# Patient Record
Sex: Male | Born: 1965 | Race: White | Hispanic: No | Marital: Single | Smoking: Never smoker
Health system: Southern US, Community
[De-identification: ages and names within clinical notes are randomized; demographics above are authoritative.]

## PROBLEM LIST (undated history)

## (undated) DIAGNOSIS — F32A Depression, unspecified: Secondary | ICD-10-CM

## (undated) DIAGNOSIS — F1910 Other psychoactive substance abuse, uncomplicated: Secondary | ICD-10-CM

## (undated) DIAGNOSIS — N611 Abscess of the breast and nipple: Secondary | ICD-10-CM

## (undated) DIAGNOSIS — Z87442 Personal history of urinary calculi: Secondary | ICD-10-CM

## (undated) DIAGNOSIS — F419 Anxiety disorder, unspecified: Secondary | ICD-10-CM

## (undated) DIAGNOSIS — R222 Localized swelling, mass and lump, trunk: Principal | ICD-10-CM

## (undated) DIAGNOSIS — M1990 Unspecified osteoarthritis, unspecified site: Secondary | ICD-10-CM

## (undated) HISTORY — DX: Anxiety disorder, unspecified: F41.9

## (undated) HISTORY — DX: Abscess of the breast and nipple: N61.1

## (undated) HISTORY — DX: Unspecified osteoarthritis, unspecified site: M19.90

## (undated) HISTORY — DX: Other psychoactive substance abuse, uncomplicated: F19.10

## (undated) HISTORY — DX: Localized swelling, mass and lump, trunk: R22.2

## (undated) HISTORY — DX: Depression, unspecified: F32.A

---

## 2006-04-10 DIAGNOSIS — N611 Abscess of the breast and nipple: Secondary | ICD-10-CM

## 2006-04-10 HISTORY — DX: Abscess of the breast and nipple: N61.1

## 2006-04-10 HISTORY — PX: ABCESS DRAINAGE: SHX399

## 2013-09-11 ENCOUNTER — Emergency Department: Payer: Self-pay | Admitting: Emergency Medicine

## 2013-09-11 LAB — CBC
HCT: 51.5 % (ref 40.0–52.0)
HGB: 16.8 g/dL (ref 13.0–18.0)
MCH: 30.3 pg (ref 26.0–34.0)
MCHC: 32.6 g/dL (ref 32.0–36.0)
MCV: 93 fL (ref 80–100)
PLATELETS: 195 10*3/uL (ref 150–440)
RBC: 5.54 10*6/uL (ref 4.40–5.90)
RDW: 14.3 % (ref 11.5–14.5)
WBC: 10.3 10*3/uL (ref 3.8–10.6)

## 2013-09-11 LAB — URINALYSIS, COMPLETE
BLOOD: NEGATIVE
Bacteria: NONE SEEN
Bilirubin,UR: NEGATIVE
GLUCOSE, UR: NEGATIVE mg/dL (ref 0–75)
Ketone: NEGATIVE
LEUKOCYTE ESTERASE: NEGATIVE
Nitrite: NEGATIVE
PH: 6 (ref 4.5–8.0)
PROTEIN: NEGATIVE
RBC,UR: 1 /HPF (ref 0–5)
Specific Gravity: 1.019 (ref 1.003–1.030)
Squamous Epithelial: NONE SEEN
WBC UR: 2 /HPF (ref 0–5)

## 2013-09-11 LAB — COMPREHENSIVE METABOLIC PANEL
ALBUMIN: 4 g/dL (ref 3.4–5.0)
AST: 27 U/L (ref 15–37)
Alkaline Phosphatase: 77 U/L
Anion Gap: 5 — ABNORMAL LOW (ref 7–16)
BUN: 14 mg/dL (ref 7–18)
Bilirubin,Total: 0.4 mg/dL (ref 0.2–1.0)
CO2: 26 mmol/L (ref 21–32)
Calcium, Total: 9.5 mg/dL (ref 8.5–10.1)
Chloride: 107 mmol/L (ref 98–107)
Creatinine: 0.87 mg/dL (ref 0.60–1.30)
GLUCOSE: 87 mg/dL (ref 65–99)
Osmolality: 276 (ref 275–301)
POTASSIUM: 4 mmol/L (ref 3.5–5.1)
SGPT (ALT): 44 U/L (ref 12–78)
Sodium: 138 mmol/L (ref 136–145)
Total Protein: 7.7 g/dL (ref 6.4–8.2)

## 2013-12-27 ENCOUNTER — Emergency Department: Payer: Self-pay

## 2017-01-15 ENCOUNTER — Emergency Department
Admission: EM | Admit: 2017-01-15 | Discharge: 2017-01-15 | Disposition: A | Discharge status: Home / Self Care | Payer: Self-pay | Attending: Emergency Medicine | Admitting: Emergency Medicine

## 2017-01-15 ENCOUNTER — Emergency Department: Payer: Self-pay

## 2017-01-15 DIAGNOSIS — R0789 Other chest pain: Secondary | ICD-10-CM | POA: Insufficient documentation

## 2017-01-15 DIAGNOSIS — R222 Localized swelling, mass and lump, trunk: Secondary | ICD-10-CM | POA: Insufficient documentation

## 2017-01-15 LAB — CBC
HCT: 44.3 % (ref 40.0–52.0)
Hemoglobin: 15.3 g/dL (ref 13.0–18.0)
MCH: 31.8 pg (ref 26.0–34.0)
MCHC: 34.5 g/dL (ref 32.0–36.0)
MCV: 92.2 fL (ref 80.0–100.0)
PLATELETS: 182 10*3/uL (ref 150–440)
RBC: 4.8 MIL/uL (ref 4.40–5.90)
RDW: 13.3 % (ref 11.5–14.5)
WBC: 9.7 10*3/uL (ref 3.8–10.6)

## 2017-01-15 LAB — COMPREHENSIVE METABOLIC PANEL
ALT: 84 U/L — AB (ref 17–63)
AST: 59 U/L — ABNORMAL HIGH (ref 15–41)
Albumin: 4 g/dL (ref 3.5–5.0)
Alkaline Phosphatase: 56 U/L (ref 38–126)
Anion gap: 7 (ref 5–15)
BUN: 13 mg/dL (ref 6–20)
CHLORIDE: 108 mmol/L (ref 101–111)
CO2: 23 mmol/L (ref 22–32)
CREATININE: 0.87 mg/dL (ref 0.61–1.24)
Calcium: 9.2 mg/dL (ref 8.9–10.3)
GFR calc Af Amer: >60 mL/min (ref >60)
GLUCOSE: 78 mg/dL (ref 65–99)
Potassium: 3.5 mmol/L (ref 3.5–5.1)
Sodium: 138 mmol/L (ref 135–145)
Total Bilirubin: 0.7 mg/dL (ref 0.3–1.2)
Total Protein: 7.7 g/dL (ref 6.5–8.1)

## 2017-01-15 LAB — TROPONIN I: Troponin I: <0.03 ng/mL (ref <0.03)

## 2017-01-15 NOTE — ED Triage Notes (Signed)
Pt states that he has been having some sob, states that he noticed a knot that came up on the left side of his chest yesterday and is uncertain if that is the problem, pt reports that it is tender to touch and denies injury, pt denies cough or congestion

## 2017-01-15 NOTE — Discharge Instructions (Signed)
Please follow up with surgery to have your nodule examined and a biopsy taken

## 2017-01-16 NOTE — ED Provider Notes (Signed)
[facility] Emergency Department Provider Note   ____________________________________________    I have reviewed the triage vital signs and the nursing notes.   HISTORY  Chief Complaint Shortness of Breath and Chest Pain     HPI Nicholas Lindsey is a 51 y.o. male who presents with chest pain which patient attributes to  a lump on his chest.Patient reports he noted a lump on the left lateral chest wall yesterday which is approximately 1 x 1 cm. It is firm and he notes it is definitely new. He states he feels sore around this area. No fevers or chills. No cough. No chest pain with exertion. Pain is mostly when he twists his torso. He describes it as a mild aching pain around the lump   No past medical history on file.  There are no active problems to display for this patient.   No past surgical history on file.  Prior to Admission medications   Not on File     Allergies Patient has no known allergies.  No family history on file.  Social History Positive smoking, social drinking Review of Systems  Constitutional: No fever/chills Eyes: No redness ENT: No sore throat. CV: As above Respiratory: No shortness of breath Gastrointestinal: No abdominal pain.  No nausea, no vomiting.   Genitourinary: Negative for dysuria. Musculoskeletal: Negative for back pain. Skin: Negative for rash. Neurological: Negative for headaches     ____________________________________________   PHYSICAL EXAM:  VITAL SIGNS: ED Triage Vitals  Enc Vitals Group     BP 01/15/17 1908 137/80     Pulse Rate 01/15/17 1908 73     Resp 01/15/17 1908 18     Temp 01/15/17 1908 98.8 F (37.1 C)     Temp Source 01/15/17 1908 Oral     SpO2 01/15/17 1908 98 %     Weight 01/15/17 1904 81.2 kg (179 lb)     Height 01/15/17 1904 1.702 m (5\' 7" )     Head Circumference --      Peak Flow --      Pain Score 01/15/17 1904 4     Pain Loc --      Pain Edu? --      Excl. in  [place]? --     Constitutional: Alert and oriented. No acute distress. Pleasant and interactive  Nose: No congestion/rhinnorhea. Mouth/Throat: Mucous membranes are moist.   Cardiovascular: Normal rate, regular rhythm. 1 x 1 cm palpable firm nodule just lateral to the sternum on the left approximately T4. Mild tenderness to palpation, no fluctuance Respiratory: Normal respiratory effort.  No retractions. Genitourinary: deferred Musculoskeletal: No lower extremity tenderness nor edema.   Neurologic:  Normal speech and language. No gross focal neurologic deficits are appreciated.   Skin:  Skin is warm, dry and intact. No rash noted.   ____________________________________________   LABS (all labs ordered are listed, but only abnormal results are displayed)  Labs Reviewed  COMPREHENSIVE METABOLIC PANEL - Abnormal; Notable for the following:       Result Value   AST 59 (*)    ALT 84 (*)    All other components within normal limits  TROPONIN I  CBC   ____________________________________________  EKG  ED ECG REPORT I, Lavonia Drafts, the attending physician, personally viewed and interpreted this ECG.  Date: 01/16/2017  Rhythm: normal sinus rhythm QRS Axis: normal Intervals: normal ST/T Wave abnormalities: normal Narrative Interpretation: no evidence of acute ischemia  ____________________________________________  RADIOLOGY  Chest  x-ray normal ____________________________________________   PROCEDURES  Procedure(s) performed: No    Critical Care performed: No ____________________________________________   INITIAL IMPRESSION / ASSESSMENT AND PLAN / ED COURSE  Pertinent labs & imaging results that were available during my care of the patient were reviewed by me and considered in my medical decision making (see chart for details).  Patient with nodule in the chest wall, this appears to cause him some discomfort. Unclear etiology of this nodule. He will need a biopsy  of this. Discussed this with him and the need for outpatient follow-up with surgery to arrange this. EKG is normal, labs are normal, chest x-ray is normal.   ____________________________________________   FINAL CLINICAL IMPRESSION(S) / ED DIAGNOSES  Final diagnoses:  Nodule of anterior chest wall  Chest wall pain      NEW MEDICATIONS STARTED DURING THIS VISIT:  There are no discharge medications for this patient.    Note:  This document was prepared using Dragon voice recognition software and may include unintentional dictation errors.    Lavonia Drafts, MD 01/16/17 [phone]

## 2017-01-19 DIAGNOSIS — R222 Localized swelling, mass and lump, trunk: Secondary | ICD-10-CM | POA: Insufficient documentation

## 2017-01-19 HISTORY — DX: Localized swelling, mass and lump, trunk: R22.2

## 2017-01-25 ENCOUNTER — Encounter: Payer: Self-pay | Admitting: General Surgery

## 2017-01-25 ENCOUNTER — Ambulatory Visit (INDEPENDENT_AMBULATORY_CARE_PROVIDER_SITE_OTHER): Payer: Self-pay | Admitting: General Surgery

## 2017-01-25 VITALS — BP 153/76 | HR 69 | Temp 97.5°F | Ht 67.0 in | Wt 177.8 lb

## 2017-01-25 DIAGNOSIS — R222 Localized swelling, mass and lump, trunk: Secondary | ICD-10-CM

## 2017-01-25 MED ORDER — HYDROCODONE-ACETAMINOPHEN 5-325 MG PO TABS: 1.0000 | ORAL_TABLET | Freq: Four times a day (QID) | ORAL | 0 refills | Status: DC | PRN

## 2017-01-25 NOTE — Progress Notes (Signed)
Patient ID: Nicholas Lindsey, male   DOB: 11-18-1965, 51 y.o.   MRN: [id number]  CC: painful chest wall mass  HPI Nicholas Lindsey is a 51 y.o. male Who presents to the clinic today for evaluation of a painful chest wall mass. He was seen in the ER for this recently and sent to [place] for continued follow-up of the aforementioned problem. Patient reports that he first noticed it about 3 months ago but has become increasingly painful. It was initially only intermittently causing him discomfort and now causes him discomfort all the time. He thinks it is getting larger. He's never had anything like this before. He has however had a history of foreign body removal from his left breast but that was a remote history and from an entirely different section of his chest. He denies any fevers, chills, nausea, vomiting, shortness of breath, diarrhea, constipation. He does have significant chest discomfort primarily at this area.  HPI  Past Medical History:  Diagnosis Date  . Left breast abscess 2008   I&D required- [place], [place] (per patient)  . Nodule of anterior chest wall 01/19/2017    Past Surgical History:  Procedure Laterality Date  . ABCESS DRAINAGE Left 2008   Breast- [place], [place]    Family History  Problem Relation Age of Onset  . Diabetes Mother   . Hypertension Mother   . Heart attack Father     Social History Social History  Substance Use Topics  . Smoking status: Never Smoker  . Smokeless tobacco: Current User    Types: Chew     Comment: 1/2 Can chew Daily  . Alcohol use Yes     Comment: 12 Beers / Weekly    No Known Allergies  Current Outpatient Prescriptions  Medication Sig Dispense Refill  . acetaminophen (TYLENOL) 325 MG tablet Take 650 mg by mouth every 6 (six) hours as needed.    Marland Kitchen ibuprofen (ADVIL,MOTRIN) 200 MG tablet Take 200 mg by mouth every 6 (six) hours as needed.     No current facility-administered medications for this visit.      Review of Systems A  multi-point review of systems was asked and was negative except for the findings documented in the history of present illness  Physical Exam Blood pressure (!) 153/76, pulse 69, temperature (!) 97.5 F (36.4 C), temperature source Oral, height 5\' 7"  (1.702 m), weight 80.6 kg (177 lb 12.8 oz). CONSTITUTIONAL: no acute distress. EYES: Pupils are equal, round, and reactive to light, Sclera are non-icteric. EARS, NOSE, MOUTH AND THROAT: The oropharynx is clear. The oral mucosa is pink and moist but he has poor dentition. Hearing is intact to voice. LYMPH NODES:  Lymph nodes in the neck are normal. RESPIRATORY:  Lungs are clear. There is normal respiratory effort, with equal breath sounds bilaterally, and without pathologic use of accessory muscles.his chest wall is tender especially at an obviously palpable area on the left side of his sternum at the level of his nipple. It is a hard 1 cm palpable area that appears to be attached to the rib at this point. CARDIOVASCULAR: Heart is regular without murmurs, gallops, or rubs. GI: The abdomen is soft, nontender, and nondistended. There are no palpable masses. There is no hepatosplenomegaly. There are normal bowel sounds in all quadrants. GU: Rectal deferred.   MUSCULOSKELETAL: Normal muscle strength and tone. No cyanosis or edema.   SKIN: Turgor is good and there are no pathologic skin lesions or ulcers. NEUROLOGIC: Motor and sensation  is grossly normal. Cranial nerves are grossly intact. PSYCH:  Oriented to person, place and time. Affect is normal.  Data Reviewed Chest x-ray from the ER reviewed which is documented as normal per the radiologist but there appears to be radiographic evidence of a calcified protuberance consistent with the painful finding on exam today.his labs from the ER are within normal limits with the exception of mildly elevated AST and ALT. I have personally reviewed the patient's imaging, laboratory findings and medical records.     Assessment    Painful chest wall mass    Plan    51 year old male with a painful chest wall mass that is new over the last 3 months. Questionable finding on radiograph which may represent this mass. Discussed with the patientthat since this is attached to the rib be better visualized with cross-sectional imaging. Also discussed that I would have him follow up with her partner that is a thoracic surgery specialist due to the intimate relation of this mass with the rib. Patient voiced understanding and agrees with this plan. We will obtain a CT scan as soon as possible as an outpatient status, provide him with a small prescription for pain medications, patient to follow-up with Dr. Genevive Bi     Time spent with the patient was 45 minutes, with more than 50% of the time spent in face-to-face education, counseling and care coordination.     Clayburn Pert, MD FACS General Surgeon 01/25/2017, 2:01 PM

## 2017-01-25 NOTE — Patient Instructions (Signed)
We have scheduled you for a CT Scan of your Chest. This has been scheduled on 01/29/17 at our [place] location. Please Check-in at 1045am, 15 minutes prior to your scheduled appointment. If you need to reschedule your Scan, you may do so by calling [phone].  Bring a list of medications with you to your appointment and nothing to eat or drink 4 hours prior to your CT Scan.  We will have you follow-up with Dr. Genevive Bi, our cardiothoracic surgeon after your CT Scan. Please see appointment information below.

## 2017-01-29 ENCOUNTER — Ambulatory Visit
Admission: RE | Admit: 2017-01-29 | Discharge: 2017-01-29 | Disposition: A | Discharge status: Home / Self Care | Payer: Self-pay | Source: Ambulatory Visit | Attending: General Surgery | Admitting: General Surgery

## 2017-01-29 ENCOUNTER — Telehealth: Payer: Self-pay

## 2017-01-29 DIAGNOSIS — R222 Localized swelling, mass and lump, trunk: Secondary | ICD-10-CM | POA: Insufficient documentation

## 2017-01-29 DIAGNOSIS — I700 Atherosclerosis of aorta: Secondary | ICD-10-CM | POA: Insufficient documentation

## 2017-01-29 MED ORDER — IOPAMIDOL (ISOVUE-300) INJECTION 61%
75.0000 mL | Freq: Once | INTRAVENOUS | Status: AC | PRN
  Administered 2017-01-29: 75 mL via INTRAVENOUS

## 2017-01-29 NOTE — Telephone Encounter (Signed)
Reviewed CT Scan with Dr. Genevive Bi. He would like patient notified of results and given his financial difficulty, he may decide if he would like to be further evaluated tomorrow by cardiothoracic surgery.  Call made to home number. Spoke with patient's brother. He states that patient is unavailable at this time. Patient's brother is emergency contact on chart. I reviewed CT results with him and explained that the patient can decide if he would like to be further evaluated by Dr. Genevive Bi tomorrow. He may call and cancel if this appointment if he feels that this appointment is no longer needed based on results of CT Scan.

## 2017-01-30 ENCOUNTER — Encounter: Payer: Self-pay | Admitting: Cardiothoracic Surgery

## 2017-01-30 ENCOUNTER — Ambulatory Visit (INDEPENDENT_AMBULATORY_CARE_PROVIDER_SITE_OTHER): Payer: Self-pay | Admitting: Cardiothoracic Surgery

## 2017-01-30 VITALS — BP 157/79 | HR 71 | Temp 98.1°F | Resp 16 | Ht 67.0 in | Wt 181.2 lb

## 2017-01-30 DIAGNOSIS — D167 Benign neoplasm of ribs, sternum and clavicle: Secondary | ICD-10-CM

## 2017-01-30 MED ORDER — GABAPENTIN 100 MG PO CAPS: 100.0000 mg | ORAL_CAPSULE | Freq: Three times a day (TID) | ORAL | 0 refills | Status: DC

## 2017-01-30 NOTE — Progress Notes (Signed)
  Patient ID: Nicholas Lindsey, male   DOB: May 24, 1965, 51 y.o.   MRN: [id number]  HISTORY: He returns today in follow-up.  He did see 1 of my partners Dr. Oliver Pila last week who obtained a CT scan of the chest.  The patient states that he continues to have pain along the left side of his sternum.  He also describes episodes of pain that involves his entire anterior chest wall.  He states that this is almost constant in nature.  It is exacerbated by pushing on the sternum.  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  He thinks that the Vicodin have helped overall.   Vitals:   01/30/17 1043  BP: (!) 157/79  Pulse: 71  Resp: 16  Temp: 98.1 F (36.7 C)  SpO2: 97%     EXAM:    Resp: Lungs are clear bilaterally.  No respiratory distress, normal effort. Heart:  Regular without murmurs Abd:  Abdomen is soft, non distended and non tender. No masses are palpable.  There is no rebound and no guarding.  Neurological: Alert and oriented to person, place, and time. Coordination normal.  Skin: Skin is warm and dry. No rash noted. No diaphoretic. No erythema. No pallor.  Psychiatric: Normal mood and affect. Normal behavior. Judgment and thought content normal.   There is a asymmetry to the body of the sternum.  There is no erythema.  There is no drainage.   ASSESSMENT: I have independently reviewed the patient's CT scan.  There is no soft tissue mass or bony tumor.  There is some asymmetry to the costal sternal junction.   PLAN:   I explained to the patient that there is no obvious pathology present.  However we will treat his pain with gabapentin.  He will continue the Vicodin as needed.  I explained to him that he can follow-up with his primary care physician for continued pain management as there is no obvious sternal fracture, sternal tumor, bone fracture or bone tumor.  There is no sign of any infection.  There is no lung or cardiac abnormality.    Nestor Lewandowsky, MD

## 2017-01-30 NOTE — Patient Instructions (Addendum)
Please pick up your prescription at the pharmacy. Begin this medication to help with the pain. Please see your Primary Care Physican for continuing medication if this helps.  You do not need to follow-up unless your pain or symptoms worsen in this area.  I feel as though Arthritis at the joint between your sternum and ribs are causing this area of pain and swelling on your chest.  Arthritis Arthritis is a term that is commonly used to refer to joint pain or joint disease. There are more than 100 types of arthritis. What are the causes? The most common cause of this condition is wear and tear of a joint. Other causes include:  Gout.  Inflammation of a joint.  An infection of a joint.  Sprains and other injuries near the joint.  A drug reaction or allergic reaction.  In some cases, the cause may not be known. What are the signs or symptoms? The main symptom of this condition is pain in the joint with movement. Other symptoms include:  Redness, swelling, or stiffness at a joint.  Warmth coming from the joint.  Fever.  Overall feeling of illness.  How is this diagnosed? This condition may be diagnosed with a physical exam and tests, including:  Blood tests.  Urine tests.  Imaging tests, such as MRI, X-rays, or a CT scan.  Sometimes, fluid is removed from a joint for testing. How is this treated? Treatment for this condition may involve:  Treatment of the cause, if it is known.  Rest.  Raising (elevating) the joint.  Applying cold or hot packs to the joint.  Medicines to improve symptoms and reduce inflammation.  Injections of a steroid such as cortisone into the joint to help reduce pain and inflammation.  Depending on the cause of your arthritis, you may need to make lifestyle changes to reduce stress on your joint. These changes may include exercising more and losing weight. Follow these instructions at home: Medicines  Take over-the-counter and  prescription medicines only as told by your health care provider.  Do not take aspirin to relieve pain if gout is suspected. Activity  Rest your joint if told by your health care provider. Rest is important when your disease is active and your joint feels painful, swollen, or stiff.  Avoid activities that make the pain worse. It is important to balance activity with rest.  Exercise your joint regularly with range-of-motion exercises as told by your health care provider. Try doing low-impact exercise, such as: ? Swimming. ? Water aerobics. ? Biking. ? Walking. Joint Care   If your joint is swollen, keep it elevated if told by your health care provider.  If your joint feels stiff in the morning, try taking a warm shower.  If directed, apply heat to the joint. If you have diabetes, do not apply heat without permission from your health care provider. ? Put a towel between the joint and the hot pack or heating pad. ? Leave the heat on the area for 20-30 minutes.  If directed, apply ice to the joint: ? Put ice in a plastic bag. ? Place a towel between your skin and the bag. ? Leave the ice on for 20 minutes, 2-3 times per day.  Keep all follow-up visits as told by your health care provider. This is important. Contact a health care provider if:  The pain gets worse.  You have a fever. Get help right away if:  You develop severe joint pain, swelling, or redness.  Many joints become painful and swollen.  You develop severe back pain.  You develop severe weakness in your leg.  You cannot control your bladder or bowels. This information is not intended to replace advice given to you by your health care provider. Make sure you discuss any questions you have with your health care provider. Document Released: 05/04/2004 Document Revised: 09/02/2015 Document Reviewed: 06/22/2014 Elsevier Interactive Patient Education  [employer].

## 2017-12-02 ENCOUNTER — Other Ambulatory Visit: Payer: Self-pay

## 2017-12-02 ENCOUNTER — Emergency Department: Payer: Self-pay

## 2017-12-02 ENCOUNTER — Encounter: Payer: Self-pay | Admitting: *Deleted

## 2017-12-02 ENCOUNTER — Emergency Department
Admission: EM | Admit: 2017-12-02 | Discharge: 2017-12-02 | Disposition: A | Discharge status: Home / Self Care | Payer: Self-pay | Attending: Emergency Medicine | Admitting: Emergency Medicine

## 2017-12-02 DIAGNOSIS — R1084 Generalized abdominal pain: Secondary | ICD-10-CM | POA: Insufficient documentation

## 2017-12-02 DIAGNOSIS — R109 Unspecified abdominal pain: Secondary | ICD-10-CM

## 2017-12-02 DIAGNOSIS — F17220 Nicotine dependence, chewing tobacco, uncomplicated: Secondary | ICD-10-CM | POA: Insufficient documentation

## 2017-12-02 HISTORY — DX: Personal history of urinary calculi: Z87.442

## 2017-12-02 LAB — BASIC METABOLIC PANEL
Anion gap: 6 (ref 5–15)
BUN: 17 mg/dL (ref 6–20)
CO2: 26 mmol/L (ref 22–32)
CREATININE: 0.78 mg/dL (ref 0.61–1.24)
Calcium: 8.8 mg/dL — ABNORMAL LOW (ref 8.9–10.3)
Chloride: 107 mmol/L (ref 98–111)
GFR calc Af Amer: >60 mL/min (ref >60)
GLUCOSE: 101 mg/dL — AB (ref 70–99)
POTASSIUM: 3.4 mmol/L — AB (ref 3.5–5.1)
Sodium: 139 mmol/L (ref 135–145)

## 2017-12-02 LAB — CBC
HEMATOCRIT: 44.3 % (ref 40.0–52.0)
Hemoglobin: 15.3 g/dL (ref 13.0–18.0)
MCH: 32.7 pg (ref 26.0–34.0)
MCHC: 34.6 g/dL (ref 32.0–36.0)
MCV: 94.5 fL (ref 80.0–100.0)
Platelets: 203 10*3/uL (ref 150–440)
RBC: 4.69 MIL/uL (ref 4.40–5.90)
RDW: 13.8 % (ref 11.5–14.5)
WBC: 6.8 10*3/uL (ref 3.8–10.6)

## 2017-12-02 LAB — URINALYSIS, COMPLETE (UACMP) WITH MICROSCOPIC
BILIRUBIN URINE: NEGATIVE
Bacteria, UA: NONE SEEN
Glucose, UA: NEGATIVE mg/dL
HGB URINE DIPSTICK: NEGATIVE
KETONES UR: NEGATIVE mg/dL
Leukocytes, UA: NEGATIVE
NITRITE: NEGATIVE
PROTEIN: NEGATIVE mg/dL
Specific Gravity, Urine: 1.026 (ref 1.005–1.030)
pH: 5 (ref 5.0–8.0)

## 2017-12-02 MED ORDER — OXYCODONE-ACETAMINOPHEN 5-325 MG PO TABS: 1.0000 | ORAL_TABLET | Freq: Four times a day (QID) | ORAL | 0 refills | Status: DC | PRN

## 2017-12-02 MED ORDER — MORPHINE SULFATE (PF) 4 MG/ML IV SOLN
4.0000 mg | Freq: Once | INTRAVENOUS | Status: AC
  Administered 2017-12-02: 4 mg via INTRAVENOUS
  Filled 2017-12-02: qty 1

## 2017-12-02 MED ORDER — ONDANSETRON HCL 4 MG/2ML IJ SOLN
4.0000 mg | INTRAMUSCULAR | Status: AC
  Administered 2017-12-02: 4 mg via INTRAVENOUS
  Filled 2017-12-02: qty 2

## 2017-12-02 MED ORDER — OXYCODONE-ACETAMINOPHEN 5-325 MG PO TABS
2.0000 | ORAL_TABLET | Freq: Once | ORAL | Status: AC
  Administered 2017-12-02: 2 via ORAL
  Filled 2017-12-02: qty 2

## 2017-12-02 MED ORDER — DOCUSATE SODIUM 100 MG PO CAPS: ORAL_CAPSULE | ORAL | 0 refills | Status: DC

## 2017-12-02 MED ORDER — ONDANSETRON 4 MG PO TBDP: ORAL_TABLET | ORAL | 0 refills | Status: DC

## 2017-12-02 MED ORDER — KETOROLAC TROMETHAMINE 30 MG/ML IJ SOLN
15.0000 mg | Freq: Once | INTRAMUSCULAR | Status: AC
  Administered 2017-12-02: 15 mg via INTRAVENOUS
  Filled 2017-12-02: qty 1

## 2017-12-02 NOTE — ED Notes (Signed)
Patient transported to CT 

## 2017-12-02 NOTE — Discharge Instructions (Signed)
As we discussed, we think you most likely had a kidney stone stuck in your ureter that you have passed but is still causing you some pain and discomfort.  Please take over-the-counter ibuprofen (Advil) 600 mg 3 times a day with meals for no more than 5 days. Take Percocet as prescribed for severe pain. Do not drink alcohol, drive or participate in any other potentially dangerous activities while taking this medication as it may make you sleepy. Do not take this medication with any other sedating medications, either prescription or over-the-counter. If you were prescribed Percocet or Vicodin, do not take these with acetaminophen (Tylenol) as it is already contained within these medications.   This medication is an opiate (or narcotic) pain medication and can be habit forming.  Use it as little as possible to achieve adequate pain control.  Do not use or use it with extreme caution if you have a history of opiate abuse or dependence.  If you are on a pain contract with your primary care doctor or a pain specialist, be sure to let them know you were prescribed this medication today from the [facility].  This medication is intended for your use only - do not give any to anyone else and keep it in a secure place where nobody else, especially children, have access to it.  It will also cause or worsen constipation, so you may want to consider taking an over-the-counter stool softener while you are taking this medication.    Return to the emergency department if you develop new or worsening symptoms that concern you.

## 2017-12-02 NOTE — ED Notes (Signed)
ED Provider at bedside. 

## 2017-12-02 NOTE — ED Notes (Signed)
Pt c/o of left flank pain for the last 3 days, worse with urination.  Pt reports hx of the same, pt pain with light palpation.

## 2017-12-02 NOTE — ED Notes (Signed)
Peripheral IV discontinued. Catheter intact. No signs of infiltration or redness. Gauze applied to IV site.   Discharge instructions reviewed with patient. Questions fielded by this RN. Patient verbalizes understanding of instructions. Patient discharged home in stable condition per [facility]bach. No acute distress noted at time of discharge.    

## 2017-12-02 NOTE — ED Triage Notes (Signed)
EMS report: Pt c/o 3 days of intermittent L flank pain, worsening over past 2 hrs. Pt has hx of kidney stones, denies n/v at this time, is pale and visibly uncomfortable.

## 2017-12-02 NOTE — ED Provider Notes (Signed)
[facility] Emergency Department Provider Note  ____________________________________________   First MD Initiated Contact with Patient 12/02/17 0246     (approximate)  I have reviewed the triage vital signs and the nursing notes.   HISTORY  Chief Complaint Flank Pain    HPI Nicholas Lindsey is a 52 y.o. male's medical history includes multiple episodes of kidney stones in the past 2 presents for evaluation of 3 days of intermittent left flank pain that is severe, sharp, and stabbing.  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  He is not having any dysuria, blood in the urine, or difficulty with urination.  The pain is been rating from his left flank downwards.  The pain will occur and then go away completely and says that he had very severe pain yesterday and thought it was gone but then came back again tonight.  He denies nausea, vomiting, diarrhea, constipation.  He also denies fever/chills, chest pain or shortness of breath.    Past Medical History:  Diagnosis Date  . History of kidney stones   . Left breast abscess 2008   I&D required- [place], [place] (per patient)  . Nodule of anterior chest wall 01/19/2017    Patient Active Problem List   Diagnosis Date Noted  . Nodule of anterior chest wall 01/19/2017    Past Surgical History:  Procedure Laterality Date  . ABCESS DRAINAGE Left 2008   Breast- [place], [place]    Prior to Admission medications   Medication Sig Start Date End Date Taking? Authorizing Provider  acetaminophen (TYLENOL) 325 MG tablet Take 650 mg by mouth every 6 (six) hours as needed.    [provider]  docusate sodium (COLACE) 100 MG capsule Take 1 tablet once or twice daily as needed for constipation while taking narcotic pain medicine 12/02/17   Hinda Kehr, MD  gabapentin (NEURONTIN) 100 MG capsule  Take 1 capsule (100 mg total) by mouth 3 (three) times daily. 01/30/17   Nestor Lewandowsky, MD  ibuprofen (ADVIL,MOTRIN) 200 MG tablet Take 200 mg by mouth every 6 (six) hours as needed.    [provider]  ondansetron (ZOFRAN ODT) 4 MG disintegrating tablet Allow 1-2 tablets to dissolve in your mouth every 8 hours as needed for nausea/vomiting 12/02/17   Hinda Kehr, MD  oxyCODONE-acetaminophen (PERCOCET) 5-325 MG tablet Take 1-2 tablets by mouth every 6 (six) hours as needed for severe pain. 12/02/17   Hinda Kehr, MD    Allergies Patient has no known allergies.  Family History  Problem Relation Age of Onset  . Diabetes Mother   . Hypertension Mother   . Heart attack Father     Social History Social History   Tobacco Use  . Smoking status: Never Smoker  . Smokeless tobacco: Current User    Types: Chew  . Tobacco comment: 1/2 Can chew Daily  Substance Use Topics  . Alcohol use: Yes    Comment: 12 Beers / Weekly  . Drug use: No    Review of Systems Constitutional: No fever/chills Eyes: No visual changes. ENT: No sore throat. Cardiovascular: Denies chest pain. Respiratory: Denies shortness of breath. Gastrointestinal: No abdominal pain.  No nausea, no vomiting.  No diarrhea.  No constipation. Genitourinary: Negative for dysuria. Musculoskeletal: Left flank pain as described above.  Negative for neck pain.  Negative for  back pain. Integumentary: Negative for rash. Neurological: Negative for headaches, focal weakness or numbness.   ____________________________________________   PHYSICAL EXAM:  VITAL SIGNS: ED Triage Vitals  Enc Vitals Group     BP 12/02/17 0107 135/77     Pulse Rate 12/02/17 0107 79     Resp 12/02/17 0107 18     Temp 12/02/17 0107 97.9 F (36.6 C)     Temp Source 12/02/17 0107 Oral     SpO2 12/02/17 0103 98 %     Weight 12/02/17 0108 77.1 kg (170 lb)     Height 12/02/17 0108 1.702 m (5\' 7" )     Head Circumference --      Peak Flow --        Pain Score 12/02/17 0107 10     Pain Loc --      Pain Edu? --      Excl. in [place]? --     Constitutional: Alert and oriented.  Does not appear to be in pain at this time. Eyes: Conjunctivae are normal.  Head: Atraumatic. Nose: No congestion/rhinnorhea. Mouth/Throat: Mucous membranes are moist. Neck: No stridor.  No meningeal signs.   Cardiovascular: Normal rate, regular rhythm. Good peripheral circulation. Grossly normal heart sounds. Respiratory: Normal respiratory effort.  No retractions. Lungs CTAB. Gastrointestinal: Soft and nontender. No distention.  Musculoskeletal: Point tenderness to the left lateral posterior ribs which could be indicative of a contusion or rib injury but also could be left CVA tenderness.  No lower extremity tenderness nor edema. No gross deformities of extremities. Neurologic:  Normal speech and language. No gross focal neurologic deficits are appreciated.  Skin:  Skin is warm, dry and intact. No rash noted including specifically at the site of tenderness on his left flank Psychiatric: Mood and affect are normal. Speech and behavior are normal.  ____________________________________________   LABS (all labs ordered are listed, but only abnormal results are displayed)  Labs Reviewed  URINALYSIS, COMPLETE (UACMP) WITH MICROSCOPIC - Abnormal; Notable for the following components:      Result Value   Color, Urine YELLOW (*)    APPearance CLEAR (*)    All other components within normal limits  BASIC METABOLIC PANEL - Abnormal; Notable for the following components:   Potassium 3.4 (*)    Glucose, Bld 101 (*)    Calcium 8.8 (*)    All other components within normal limits  CBC   ____________________________________________  EKG  None - EKG not ordered by ED physician ____________________________________________  RADIOLOGY   ED MD interpretation: There is no evidence of any ureteral stones and no hydronephrosis or perinephric edema.  He does have  some small intrarenal stones bilaterally.  There is an area of abnormality most indicative of enteritis but this does not correlate clinically.  Official radiology report(s): Ct Renal Stone Study  Result Date: 12/02/2017 CLINICAL DATA:  Left flank pain.  Recurrent stone disease suspected. EXAM: CT ABDOMEN AND PELVIS WITHOUT CONTRAST TECHNIQUE: Multidetector CT imaging of the abdomen and pelvis was performed following the standard protocol without IV contrast. COMPARISON:  None. FINDINGS: Lower chest: Motion artifact, no acute findings. Hepatobiliary: Mild motion artifact. No evidence of focal lesion. Gallbladder physiologically distended, no calcified stone. No biliary dilatation. Pancreas: Motion artifact limitation, allowing for this, no evidence peripancreatic inflammation. No ductal dilatation. Spleen: No focal abnormality allowing for motion.  Normal in size. Adrenals/Urinary Tract: Normal adrenal glands. Two punctate nonobstructing stones in the left kidney. No hydronephrosis. No perinephric edema. Left ureter is  decompressed without stone along the course. Three nonobstructing stones in the right kidney without hydronephrosis. No perinephric edema. The right ureter is decompressed without stone along the course. Urinary bladder is partially distended without stone or wall thickening. Stomach/Bowel: Bowel evaluation is limited in the absence of enteric contrast. Additionally there is motion artifact through the upper abdomen. Stomach is partially distended. Small bowel wall thickening in the left abdomen with mildly dilated bowel loops, measuring up to 3.4 cm. No discrete transition point, however distal small bowel is nondistended. Mild associated mesenteric edema and small mesenteric nodes. Moderate colonic stool burden without colonic wall thickening. Normal appendix. Vascular/Lymphatic: Normal caliber abdominal aorta with mild atherosclerosis. Scattered prominent nodes including a hepatic caval node  measuring 11 mm image 27 series 2. There prominent central mesenteric nodes that remain subcentimeter. No pelvic adenopathy. Reproductive: Prostate is unremarkable. No free air, free fluid, or intra-abdominal fluid collection. Other: No free air, free fluid, or intra-abdominal fluid collection. 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 Degenerative change of both hips. Scattered bone islands in the pelvis. IMPRESSION: 1. Bilateral nonobstructing renal calculi. No hydronephrosis or obstructive uropathy. 2. Prominent small bowel in the left abdomen with wall thickening and mild mesenteric edema. No discrete transition point. Findings are suspicious for enteritis, but nonspecific. Detailed bowel evaluation is limited in the absence of enteric contrast. Electronically Signed   By: Jeb Levering M.D.   On: 12/02/2017 01:58    ____________________________________________   PROCEDURES  Critical Care performed: No   Procedure(s) performed:   Procedures   ____________________________________________   INITIAL IMPRESSION / ASSESSMENT AND PLAN / ED COURSE  As part of my medical decision making, I reviewed the following data within the [facility] notes reviewed and incorporated and Labs reviewed and check the [place] controlled substance database    Differential diagnosis includes, but is not limited to, renal colic/ureteral colic, UTI, pyelonephritis, musculoskeletal pain, rib injury (contusion versus fracture), renal ischemia, mesenteric ischemia.  The patient appears uncomfortable initially but does look better after morphine 4 mg IV, Zofran 4 mg IV, and Toradol 15 mg IV.  He is ambulatory without any difficulty.  He reports that he still hurts when I push on the area and he still has some pain just at rest.  I discussed the results of his work-up with him; his urinalysis was completely unremarkable, CBC  was normal, and his basic metabolic panel was also within normal limits except for slightly decreased potassium.  I told him about the findings on the CT scan that the radiologist suggested could represent a mild enteritis, but he is having no symptoms consistent with this at all including no vomiting, diarrhea, and no tenderness to palpation of the abdomen.  I told him that most likely he has passed some stones and has some residual renal colic is resolved and he seems comfortable with this explanation.  I have checked the drug database and he has no recent prescriptions and is at low risk for abuse so I wrote him prescriptions for Percocet and Zofran I gave my usual customary return precautions.  He understands and agrees with plan    ____________________________________________  FINAL CLINICAL IMPRESSION(S) / ED DIAGNOSES  Final diagnoses:  Left flank pain     MEDICATIONS GIVEN DURING THIS VISIT:  Medications  morphine 4 MG/ML injection 4 mg (4 mg Intravenous Given 12/02/17 0124)  ketorolac (TORADOL) 30 MG/ML injection 15 mg (15 mg Intravenous  Given 12/02/17 0126)  ondansetron (ZOFRAN) injection 4 mg (4 mg Intravenous Given 12/02/17 0122)  oxyCODONE-acetaminophen (PERCOCET/ROXICET) 5-325 MG per tablet 2 tablet (2 tablets Oral Given 12/02/17 0314)     ED Discharge Orders         Ordered    oxyCODONE-acetaminophen (PERCOCET) 5-325 MG tablet  Every 6 hours PRN     12/02/17 0258    docusate sodium (COLACE) 100 MG capsule     12/02/17 0258    ondansetron (ZOFRAN ODT) 4 MG disintegrating tablet     12/02/17 0258           Note:  This document was prepared using Dragon voice recognition software and may include unintentional dictation errors.    Hinda Kehr, MD 12/02/17 [phone]

## 2018-02-28 ENCOUNTER — Emergency Department
Admission: EM | Admit: 2018-02-28 | Discharge: 2018-02-28 | Disposition: A | Discharge status: Home / Self Care | Payer: Self-pay | Attending: Emergency Medicine | Admitting: Emergency Medicine

## 2018-02-28 ENCOUNTER — Other Ambulatory Visit: Payer: Self-pay

## 2018-02-28 ENCOUNTER — Encounter: Payer: Self-pay | Admitting: Emergency Medicine

## 2018-02-28 DIAGNOSIS — S50362A Insect bite (nonvenomous) of left elbow, initial encounter: Secondary | ICD-10-CM | POA: Insufficient documentation

## 2018-02-28 DIAGNOSIS — Y9289 Other specified places as the place of occurrence of the external cause: Secondary | ICD-10-CM | POA: Insufficient documentation

## 2018-02-28 DIAGNOSIS — R2232 Localized swelling, mass and lump, left upper limb: Secondary | ICD-10-CM | POA: Insufficient documentation

## 2018-02-28 DIAGNOSIS — Y998 Other external cause status: Secondary | ICD-10-CM | POA: Insufficient documentation

## 2018-02-28 DIAGNOSIS — F17228 Nicotine dependence, chewing tobacco, with other nicotine-induced disorders: Secondary | ICD-10-CM | POA: Insufficient documentation

## 2018-02-28 DIAGNOSIS — M25522 Pain in left elbow: Secondary | ICD-10-CM | POA: Insufficient documentation

## 2018-02-28 DIAGNOSIS — L03114 Cellulitis of left upper limb: Secondary | ICD-10-CM | POA: Insufficient documentation

## 2018-02-28 DIAGNOSIS — Y9389 Activity, other specified: Secondary | ICD-10-CM | POA: Insufficient documentation

## 2018-02-28 DIAGNOSIS — W57XXXA Bitten or stung by nonvenomous insect and other nonvenomous arthropods, initial encounter: Secondary | ICD-10-CM | POA: Insufficient documentation

## 2018-02-28 LAB — CBC WITH DIFFERENTIAL/PLATELET
Abs Immature Granulocytes: 0.03 10*3/uL (ref 0.00–0.07)
Basophils Absolute: 0 10*3/uL (ref 0.0–0.1)
Basophils Relative: 1 %
Eosinophils Absolute: 0.1 10*3/uL (ref 0.0–0.5)
Eosinophils Relative: 1 %
HEMATOCRIT: 46.2 % (ref 39.0–52.0)
HEMOGLOBIN: 15.3 g/dL (ref 13.0–17.0)
Immature Granulocytes: 0 %
LYMPHS ABS: 1.8 10*3/uL (ref 0.7–4.0)
LYMPHS PCT: 23 %
MCH: 30.7 pg (ref 26.0–34.0)
MCHC: 33.1 g/dL (ref 30.0–36.0)
MCV: 92.6 fL (ref 80.0–100.0)
MONO ABS: 0.7 10*3/uL (ref 0.1–1.0)
Monocytes Relative: 9 %
NEUTROS ABS: 5.2 10*3/uL (ref 1.7–7.7)
Neutrophils Relative %: 66 %
Platelets: 194 10*3/uL (ref 150–400)
RBC: 4.99 MIL/uL (ref 4.22–5.81)
RDW: 13.1 % (ref 11.5–15.5)
WBC: 7.8 10*3/uL (ref 4.0–10.5)
nRBC: 0 % (ref 0.0–0.2)

## 2018-02-28 MED ORDER — CLINDAMYCIN HCL 300 MG PO CAPS: 300.0000 mg | ORAL_CAPSULE | Freq: Three times a day (TID) | ORAL | 0 refills | Status: AC

## 2018-02-28 MED ORDER — TRAMADOL HCL 50 MG PO TABS
ORAL_TABLET | ORAL | Status: AC
  Filled 2018-02-28: qty 1

## 2018-02-28 MED ORDER — ONDANSETRON 4 MG PO TBDP
4.0000 mg | ORAL_TABLET | Freq: Once | ORAL | Status: AC
  Administered 2018-02-28: 4 mg via ORAL

## 2018-02-28 MED ORDER — CLINDAMYCIN PHOSPHATE 600 MG/50ML IV SOLN
600.0000 mg | Freq: Once | INTRAVENOUS | Status: AC
  Administered 2018-02-28: 600 mg via INTRAVENOUS
  Filled 2018-02-28: qty 50

## 2018-02-28 MED ORDER — NAPROXEN 500 MG PO TABS: 500.0000 mg | ORAL_TABLET | Freq: Two times a day (BID) | ORAL | 0 refills | Status: AC

## 2018-02-28 MED ORDER — TRAMADOL HCL 50 MG PO TABS
50.0000 mg | ORAL_TABLET | Freq: Once | ORAL | Status: AC
  Administered 2018-02-28: 50 mg via ORAL

## 2018-02-28 MED ORDER — KETOROLAC TROMETHAMINE 30 MG/ML IJ SOLN
30.0000 mg | Freq: Once | INTRAMUSCULAR | Status: AC
  Administered 2018-02-28: 30 mg via INTRAVENOUS
  Filled 2018-02-28: qty 1

## 2018-02-28 NOTE — ED Provider Notes (Addendum)
[facility] Emergency Department Provider Note ____________________________________________  Time seen: 1228  I have reviewed the triage vital signs and the nursing notes.  HISTORY  Chief Complaint  Insect Bite  HPI Nicholas Lindsey is a 52 y.o. male who presents himself to the ED for evaluation of pain and swelling to the antecubital region of his left elbow.  Patient describes he was working under house 2 days ago, when he felt a bite to his left forearm.  Since that time is had pain and swelling to the fold of the elbow.  Patient is unclear what bit him but he presents now with a red raised area.  He denies any itching, draining, or abscess formation.  He does note some tightness and swelling down the forearm into the hand.  Denies any interim fevers, chills, or sweats.  Patient has not taken any medications in the interim for symptom relief.  No history of anaphylaxis.  Past Medical History:  Diagnosis Date  . History of kidney stones   . Left breast abscess 2008   I&D required- [place], [place] (per patient)  . Nodule of anterior chest wall 01/19/2017    Patient Active Problem List   Diagnosis Date Noted  . Nodule of anterior chest wall 01/19/2017    Past Surgical History:  Procedure Laterality Date  . ABCESS DRAINAGE Left 2008   Breast- [place], [place]    Prior to Admission medications   Medication Sig Start Date End Date Taking? Authorizing Provider  acetaminophen (TYLENOL) 325 MG tablet Take 650 mg by mouth every 6 (six) hours as needed.    [provider]  clindamycin (CLEOCIN) 300 MG capsule Take 1 capsule (300 mg total) by mouth 3 (three) times daily for 10 days. 02/28/18 03/10/18  Rukia Mcgillivray, Dannielle Karvonen, PA-C  docusate sodium (COLACE) 100 MG capsule Take 1 tablet once or twice daily as needed for constipation while taking narcotic pain medicine 12/02/17   Hinda Kehr, MD  gabapentin (NEURONTIN) 100 MG capsule Take 1 capsule (100 mg total) by  mouth 3 (three) times daily. 01/30/17   Nestor Lewandowsky, MD  ibuprofen (ADVIL,MOTRIN) 200 MG tablet Take 200 mg by mouth every 6 (six) hours as needed.    [provider]  naproxen (NAPROSYN) 500 MG tablet Take 1 tablet (500 mg total) by mouth 2 (two) times daily with a meal for 15 days. 02/28/18 03/15/18  Harvy Riera, Dannielle Karvonen, PA-C  ondansetron (ZOFRAN ODT) 4 MG disintegrating tablet Allow 1-2 tablets to dissolve in your mouth every 8 hours as needed for nausea/vomiting 12/02/17   Hinda Kehr, MD  oxyCODONE-acetaminophen (PERCOCET) 5-325 MG tablet Take 1-2 tablets by mouth every 6 (six) hours as needed for severe pain. 12/02/17   Hinda Kehr, MD    Allergies Patient has no known allergies.  Family History  Problem Relation Age of Onset  . Diabetes Mother   . Hypertension Mother   . Heart attack Father     Social History Social History   Tobacco Use  . Smoking status: Never Smoker  . Smokeless tobacco: Current User    Types: Chew  . Tobacco comment: 1/2 Can chew Daily  Substance Use Topics  . Alcohol use: Yes    Comment: 12 Beers / Weekly  . Drug use: No    Review of Systems  Constitutional: Negative for fever. Eyes: Negative for visual changes. ENT: Negative for sore throat. Cardiovascular: Negative for chest pain. Respiratory: Negative for shortness of breath. Gastrointestinal: Negative  for abdominal pain, vomiting and diarrhea. Genitourinary: Negative for dysuria. Musculoskeletal: Negative for back pain. Skin: Negative for rash. Insect bite as above Neurological: Negative for headaches, focal weakness or numbness. ____________________________________________  PHYSICAL EXAM:  VITAL SIGNS: ED Triage Vitals [02/28/18 1152]  Enc Vitals Group     BP (!) 152/73     Pulse Rate 71     Resp 16     Temp (!) 97.2 F (36.2 C)     Temp Source Axillary     SpO2 100 %     Weight 155 lb (70.3 kg)     Height 5\' 7"  (1.702 m)     Head Circumference      Peak  Flow      Pain Score 6     Pain Loc      Pain Edu?      Excl. in [place]?     Constitutional: Alert and oriented. Well appearing and in no distress. Head: Normocephalic and atraumatic. Eyes: Conjunctivae are normal. Normal extraocular movements Cardiovascular: Normal rate, regular rhythm. Normal distal pulses. Respiratory: Normal respiratory effort. No wheezes/rales/rhonchi. Musculoskeletal: Nontender with normal range of motion in all extremities.  Neurologic:  Normal gait without ataxia. Normal speech and language. No gross focal neurologic deficits are appreciated. Skin:  Skin is warm, dry and intact. No rash noted.  Left antecubital region with a large erythematous whelp, consistent with a local reaction to insect bite.  There is no central punctum, necrosis, abscess formation, or skin breakdown.  The forearm distally is edematous to the hand.  No warmth, erythema, induration, streaking or lymphangitis is appreciated. Psychiatric: Mood and affect are normal. Patient exhibits appropriate insight and judgment. ____________________________________________   LABS (pertinent positives/negatives) Labs Reviewed  CBC WITH DIFFERENTIAL/PLATELET  ____________________________________________  PROCEDURES  Procedures Clindamycin 600 mg IVP Toradol 30 mg IVP ____________________________________________  INITIAL IMPRESSION / ASSESSMENT AND PLAN / ED COURSE  Patient with ED evaluation of local reaction to insect bite.  There is also some dependent edema noted to the same upper extremity.  Patient treated empirically with IV antibiotics.  He is afebrile without signs of an acute infectious process as the CBC is reassuring at this time.  He will be discharged with a prescription for oral clindamycin to take as directed.  He is encouraged to follow-up with the local community clinic for ongoing symptom management.  Return precautions have been  reviewed. ____________________________________________  FINAL CLINICAL IMPRESSION(S) / ED DIAGNOSES  Final diagnoses:  Insect bite of left elbow, initial encounter  Cellulitis of left upper extremity     Kyleena Scheirer, Dannielle Karvonen, PA-C 02/28/18 1347    Chalsea Darko, Dannielle Karvonen, PA-C 02/28/18 1350    Earleen Newport, MD 02/28/18 1409

## 2018-02-28 NOTE — Discharge Instructions (Signed)
You have a local reaction and some swelling following an insect bite. You are being treated with antibiotics for infection prevention. Take the antibiotic as directed, and the pain medicine as needed. Follow-up with [facility], or return as needed, for signs of worsening infection or swelling.

## 2018-02-28 NOTE — ED Triage Notes (Signed)
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 Patient states swelling and pain has been worsening since then. Patient has red raised area to antecubital area of left arm with swell down forearm and into hand. No redness noted above bite. Patient denies fever or chills.

## 2018-02-28 NOTE — ED Notes (Addendum)
See triage note.  Not sure what bit him, but he was under a house. Has area on left antecubital that is red and surrounding area is swollen without redness.  Good pulse and sensation distally.  Also says was on antibiotic for abscess in mouth.  Never did clear it up all the way, and then he moved here and it is back now.

## 2018-04-03 ENCOUNTER — Other Ambulatory Visit: Payer: Self-pay

## 2018-04-03 ENCOUNTER — Emergency Department: Payer: Self-pay

## 2018-04-03 ENCOUNTER — Inpatient Hospital Stay
Admission: EM | Admit: 2018-04-03 | Discharge: 2018-04-04 | DRG: 603 | Disposition: A | Discharge status: Home / Self Care | Payer: Self-pay | Attending: Internal Medicine | Admitting: Internal Medicine

## 2018-04-03 DIAGNOSIS — L03211 Cellulitis of face: Principal | ICD-10-CM

## 2018-04-03 DIAGNOSIS — E876 Hypokalemia: Secondary | ICD-10-CM | POA: Diagnosis present

## 2018-04-03 DIAGNOSIS — R739 Hyperglycemia, unspecified: Secondary | ICD-10-CM | POA: Diagnosis present

## 2018-04-03 DIAGNOSIS — Z791 Long term (current) use of non-steroidal anti-inflammatories (NSAID): Secondary | ICD-10-CM

## 2018-04-03 DIAGNOSIS — K047 Periapical abscess without sinus: Secondary | ICD-10-CM

## 2018-04-03 DIAGNOSIS — Z79891 Long term (current) use of opiate analgesic: Secondary | ICD-10-CM

## 2018-04-03 DIAGNOSIS — J320 Chronic maxillary sinusitis: Secondary | ICD-10-CM | POA: Diagnosis present

## 2018-04-03 DIAGNOSIS — Z560 Unemployment, unspecified: Secondary | ICD-10-CM

## 2018-04-03 DIAGNOSIS — Z79899 Other long term (current) drug therapy: Secondary | ICD-10-CM

## 2018-04-03 DIAGNOSIS — Z833 Family history of diabetes mellitus: Secondary | ICD-10-CM

## 2018-04-03 DIAGNOSIS — Z720 Tobacco use: Secondary | ICD-10-CM

## 2018-04-03 DIAGNOSIS — Z87442 Personal history of urinary calculi: Secondary | ICD-10-CM

## 2018-04-03 DIAGNOSIS — Z8249 Family history of ischemic heart disease and other diseases of the circulatory system: Secondary | ICD-10-CM

## 2018-04-03 DIAGNOSIS — S0240DA Maxillary fracture, left side, initial encounter for closed fracture: Secondary | ICD-10-CM | POA: Diagnosis present

## 2018-04-03 DIAGNOSIS — R4781 Slurred speech: Secondary | ICD-10-CM | POA: Diagnosis present

## 2018-04-03 LAB — CBC WITH DIFFERENTIAL/PLATELET
Abs Immature Granulocytes: 0.02 10*3/uL (ref 0.00–0.07)
Basophils Absolute: 0 10*3/uL (ref 0.0–0.1)
Basophils Relative: 1 %
Eosinophils Absolute: 0.2 10*3/uL (ref 0.0–0.5)
Eosinophils Relative: 2 %
HCT: 43.5 % (ref 39.0–52.0)
Hemoglobin: 14.7 g/dL (ref 13.0–17.0)
Immature Granulocytes: 0 %
LYMPHS PCT: 19 %
Lymphs Abs: 1.6 10*3/uL (ref 0.7–4.0)
MCH: 30.9 pg (ref 26.0–34.0)
MCHC: 33.8 g/dL (ref 30.0–36.0)
MCV: 91.4 fL (ref 80.0–100.0)
Monocytes Absolute: 0.7 10*3/uL (ref 0.1–1.0)
Monocytes Relative: 9 %
Neutro Abs: 5.7 10*3/uL (ref 1.7–7.7)
Neutrophils Relative %: 69 %
Platelets: 228 10*3/uL (ref 150–400)
RBC: 4.76 MIL/uL (ref 4.22–5.81)
RDW: 13.3 % (ref 11.5–15.5)
WBC: 8.2 10*3/uL (ref 4.0–10.5)
nRBC: 0 % (ref 0.0–0.2)

## 2018-04-03 LAB — HEPATIC FUNCTION PANEL
ALT: 53 U/L — AB (ref 0–44)
AST: 38 U/L (ref 15–41)
Albumin: 4.1 g/dL (ref 3.5–5.0)
Alkaline Phosphatase: 62 U/L (ref 38–126)
BILIRUBIN INDIRECT: 0.8 mg/dL (ref 0.3–0.9)
Bilirubin, Direct: 0.2 mg/dL (ref 0.0–0.2)
Total Bilirubin: 1 mg/dL (ref 0.3–1.2)
Total Protein: 8 g/dL (ref 6.5–8.1)

## 2018-04-03 LAB — BASIC METABOLIC PANEL
Anion gap: 10 (ref 5–15)
BUN: 21 mg/dL — ABNORMAL HIGH (ref 6–20)
CO2: 24 mmol/L (ref 22–32)
CREATININE: 0.84 mg/dL (ref 0.61–1.24)
Calcium: 8.7 mg/dL — ABNORMAL LOW (ref 8.9–10.3)
Chloride: 102 mmol/L (ref 98–111)
GFR calc Af Amer: >60 mL/min (ref >60)
GFR calc non Af Amer: >60 mL/min (ref >60)
Glucose, Bld: 114 mg/dL — ABNORMAL HIGH (ref 70–99)
Potassium: 3.4 mmol/L — ABNORMAL LOW (ref 3.5–5.1)
Sodium: 136 mmol/L (ref 135–145)

## 2018-04-03 LAB — LACTIC ACID, PLASMA: Lactic Acid, Venous: 1.2 mmol/L (ref 0.5–1.9)

## 2018-04-03 LAB — PHOSPHORUS: Phosphorus: 2.5 mg/dL (ref 2.5–4.6)

## 2018-04-03 LAB — MAGNESIUM: Magnesium: 2.4 mg/dL (ref 1.7–2.4)

## 2018-04-03 MED ORDER — LIDOCAINE VISCOUS HCL 2 % MT SOLN
5.0000 mL | Freq: Four times a day (QID) | OROMUCOSAL | Status: DC | PRN
  Filled 2018-04-03: qty 15

## 2018-04-03 MED ORDER — IOHEXOL 300 MG/ML  SOLN
75.0000 mL | Freq: Once | INTRAMUSCULAR | Status: AC | PRN
  Administered 2018-04-03: 75 mL via INTRAVENOUS

## 2018-04-03 MED ORDER — DEXAMETHASONE SODIUM PHOSPHATE 10 MG/ML IJ SOLN
10.0000 mg | INTRAMUSCULAR | Status: DC
  Filled 2018-04-03: qty 1

## 2018-04-03 MED ORDER — LACTATED RINGERS IV SOLN: INTRAVENOUS | Status: AC

## 2018-04-03 MED ORDER — DEXAMETHASONE SODIUM PHOSPHATE 10 MG/ML IJ SOLN
10.0000 mg | Freq: Once | INTRAMUSCULAR | Status: AC
  Administered 2018-04-03: 10 mg via INTRAVENOUS
  Filled 2018-04-03: qty 1

## 2018-04-03 MED ORDER — ACETAMINOPHEN 325 MG PO TABS: 650.0000 mg | ORAL_TABLET | Freq: Four times a day (QID) | ORAL | Status: DC | PRN

## 2018-04-03 MED ORDER — MAGIC MOUTHWASH W/LIDOCAINE: 10.0000 mL | Freq: Four times a day (QID) | ORAL | Status: DC | PRN

## 2018-04-03 MED ORDER — ONDANSETRON HCL 4 MG PO TABS: 4.0000 mg | ORAL_TABLET | Freq: Four times a day (QID) | ORAL | Status: DC | PRN

## 2018-04-03 MED ORDER — MORPHINE SULFATE (PF) 2 MG/ML IV SOLN: 2.0000 mg | INTRAVENOUS | Status: DC | PRN

## 2018-04-03 MED ORDER — POTASSIUM CHLORIDE 20 MEQ PO PACK
40.0000 meq | PACK | Freq: Once | ORAL | Status: AC
  Administered 2018-04-03: 40 meq via ORAL
  Filled 2018-04-03: qty 2

## 2018-04-03 MED ORDER — ACETAMINOPHEN 650 MG RE SUPP: 650.0000 mg | Freq: Four times a day (QID) | RECTAL | Status: DC | PRN

## 2018-04-03 MED ORDER — SODIUM CHLORIDE 0.9 % IV SOLN
3.0000 g | INTRAVENOUS | Status: AC
  Administered 2018-04-03: 3 g via INTRAVENOUS
  Filled 2018-04-03: qty 3

## 2018-04-03 MED ORDER — LACTATED RINGERS IV BOLUS
1000.0000 mL | Freq: Once | INTRAVENOUS | Status: AC
  Administered 2018-04-03: 1000 mL via INTRAVENOUS

## 2018-04-03 MED ORDER — ONDANSETRON HCL 4 MG/2ML IJ SOLN: 4.0000 mg | Freq: Four times a day (QID) | INTRAMUSCULAR | Status: DC | PRN

## 2018-04-03 MED ORDER — MORPHINE SULFATE (PF) 4 MG/ML IV SOLN: 4.0000 mg | INTRAVENOUS | Status: DC | PRN

## 2018-04-03 MED ORDER — BISACODYL 5 MG PO TBEC: 5.0000 mg | DELAYED_RELEASE_TABLET | Freq: Every day | ORAL | Status: DC | PRN

## 2018-04-03 MED ORDER — SENNOSIDES-DOCUSATE SODIUM 8.6-50 MG PO TABS: 1.0000 | ORAL_TABLET | Freq: Every evening | ORAL | Status: DC | PRN

## 2018-04-03 MED ORDER — MORPHINE SULFATE (PF) 4 MG/ML IV SOLN
4.0000 mg | Freq: Once | INTRAVENOUS | Status: AC
  Administered 2018-04-03: 4 mg via INTRAVENOUS
  Filled 2018-04-03: qty 1

## 2018-04-03 MED ORDER — AMOXICILLIN-POT CLAVULANATE 875-125 MG PO TABS: 1.0000 | ORAL_TABLET | Freq: Two times a day (BID) | ORAL | Status: DC

## 2018-04-03 MED ORDER — ENOXAPARIN SODIUM 40 MG/0.4ML ~~LOC~~ SOLN
40.0000 mg | SUBCUTANEOUS | Status: DC
  Administered 2018-04-03: 40 mg via SUBCUTANEOUS
  Filled 2018-04-03: qty 0.4

## 2018-04-03 MED ORDER — SODIUM CHLORIDE 0.9 % IV SOLN
3.0000 g | Freq: Four times a day (QID) | INTRAVENOUS | Status: DC
  Administered 2018-04-03 – 2018-04-04 (×6): 3 g via INTRAVENOUS
  Filled 2018-04-03 (×10): qty 3

## 2018-04-03 MED ORDER — ONDANSETRON HCL 4 MG/2ML IJ SOLN
4.0000 mg | INTRAMUSCULAR | Status: AC
  Administered 2018-04-03: 4 mg via INTRAVENOUS
  Filled 2018-04-03: qty 2

## 2018-04-03 MED ORDER — MAGIC MOUTHWASH
5.0000 mL | Freq: Four times a day (QID) | ORAL | Status: DC | PRN
  Filled 2018-04-03: qty 10

## 2018-04-03 MED ORDER — PROMETHAZINE HCL 25 MG/ML IJ SOLN: 12.5000 mg | Freq: Four times a day (QID) | INTRAMUSCULAR | Status: DC | PRN

## 2018-04-03 MED ORDER — HYDROCODONE-ACETAMINOPHEN 5-325 MG PO TABS
1.0000 | ORAL_TABLET | Freq: Four times a day (QID) | ORAL | Status: DC | PRN
  Administered 2018-04-03: 2 via ORAL
  Administered 2018-04-03: 1 via ORAL
  Filled 2018-04-03: qty 2
  Filled 2018-04-03: qty 1

## 2018-04-03 NOTE — H&P (Signed)
[place] [facility] [place] NAME: Nicholas Lindsey    MR#:  [id number]  DATE OF BIRTH:  1965/09/13  DATE OF ADMISSION:  04/03/2018  PRIMARY CARE PHYSICIAN: Patient, No Pcp Per   REQUESTING/REFERRING PHYSICIAN: Hinda Kehr, MD  CHIEF COMPLAINT:   Chief Complaint  Patient presents with  . Dental Pain  . Eye Problem    HISTORY OF PRESENT ILLNESS:  Nicholas Lindsey  is a 52 y.o. male with a known history of nephrolithiasis, p/w dental infxn, facial + periorbital/preseptal cellulitis, SIRS (-). He states that he has had a dental infxn/abscess of his lower gingiva/teeth, which he states has been an active ongoing issue x1.5-65yrs. He states he has been on 2-3 courses of outpt ABx. Each time he take ABx, he states the issue gets better for a short time, but the recurs. He states that he felt an area of soreness on the inside of his mouth under his lower lip, which he states he pressed on with his fingers prior to going to bed, in order to see if he could express pus. He states there was no discharge. He went to bed, and woke from sleep ~1.5-2hrs later to discover swelling of his L face, w/ difficulty speaking. He denies fever, chills, diaphoresis, night sweats, rigors, abdominal pain, nausea, vomiting, diarrhea. He is well-appearing, non-toxic and in no distress.  Pt is able to open his mouth without difficulty. He is not drooling, and does not exhibit lockjaw/trismus or "hot potato voice". He does not have sore throat, and there is no evidence of epiglottitis, peritonsillar abscess or Ludwig's angina. He does not exhibit strabismus/opthalmoplegia, and extraocular muscle movements are intact, unencumbered and pain-free; there is no suggestion of orbital cellulitis. His L facial swelling has improved significantly since receiving IV ABx and steroids in the ED. SIRS (-). CT OMFS (+) "Left facial cellulitis and suspected nasal labial phlegmon. No abscess. No postseptal  extent." ENT (Dr. Kathyrn Sheriff) contacted by ED provider (Dr. Karma Greaser), and recommended Unasyn 3g IV q6h x48hr + Decadron 10mg  IV qD (while inpt), followed by Augmentin 875mg  PO BID x10-14d (on discharge). Does not have a dentist and is not insured. He states he also recently lost his job and doesn't have any money. States he chews tobacco. I have told him to stop.   PAST MEDICAL HISTORY:   Past Medical History:  Diagnosis Date  . History of kidney stones   . Left breast abscess 2008   I&D required- [place] [place] (per patient)  . Nodule of anterior chest wall 01/19/2017    PAST SURGICAL HISTORY:   Past Surgical History:  Procedure Laterality Date  . ABCESS DRAINAGE Left 2008   Breast- [place]    SOCIAL HISTORY:   Social History   Tobacco Use  . Smoking status: Never Smoker  . Smokeless tobacco: Current User    Types: Chew  . Tobacco comment: 1/2 Can chew Daily  Substance Use Topics  . Alcohol use: Yes    Comment: 12 Beers / Weekly    FAMILY HISTORY:   Family History  Problem Relation Age of Onset  . Diabetes Mother   . Hypertension Mother   . Heart attack Father     DRUG ALLERGIES:  No Known Allergies  REVIEW OF SYSTEMS:   Review of Systems  Constitutional: Negative for chills, diaphoresis, fever, malaise/fatigue and weight loss.  HENT: Negative for congestion, ear pain, hearing loss, nosebleeds, sinus pain, sore throat and tinnitus.  Eyes: Negative for blurred vision, double vision and photophobia.  Respiratory: Negative for cough, hemoptysis, sputum production, shortness of breath and wheezing.   Cardiovascular: Negative for chest pain, palpitations, orthopnea, claudication, leg swelling and PND.  Gastrointestinal: Negative for abdominal pain, blood in stool, constipation, diarrhea, heartburn, melena, nausea and vomiting.  Genitourinary: Negative for dysuria, frequency, hematuria and urgency.  Musculoskeletal: Negative for back pain, joint pain, myalgias  and neck pain.  Skin: Negative for itching and rash.  Neurological: Negative for dizziness, tingling, tremors, sensory change, speech change, focal weakness, seizures, loss of consciousness, weakness and headaches.  Psychiatric/Behavioral: Negative for depression and memory loss. The patient is not nervous/anxious and does not have insomnia.    MEDICATIONS AT HOME:   Prior to Admission medications   Medication Sig Start Date End Date Taking? Authorizing Provider  acetaminophen (TYLENOL) 325 MG tablet Take 650 mg by mouth every 6 (six) hours as needed.    [provider]  docusate sodium (COLACE) 100 MG capsule Take 1 tablet once or twice daily as needed for constipation while taking narcotic pain medicine 12/02/17   Hinda Kehr, MD  gabapentin (NEURONTIN) 100 MG capsule Take 1 capsule (100 mg total) by mouth 3 (three) times daily. 01/30/17   Nestor Lewandowsky, MD  ibuprofen (ADVIL,MOTRIN) 200 MG tablet Take 200 mg by mouth every 6 (six) hours as needed.    [provider]  ondansetron (ZOFRAN ODT) 4 MG disintegrating tablet Allow 1-2 tablets to dissolve in your mouth every 8 hours as needed for nausea/vomiting 12/02/17   Hinda Kehr, MD  oxyCODONE-acetaminophen (PERCOCET) 5-325 MG tablet Take 1-2 tablets by mouth every 6 (six) hours as needed for severe pain. 12/02/17   Hinda Kehr, MD      VITAL SIGNS:  Blood pressure (!) 141/90, pulse 72, temperature 97.7 F (36.5 C), temperature source Oral, resp. rate 18, height 5\' 7"  (1.702 m), weight 68.9 kg, SpO2 97 %.  PHYSICAL EXAMINATION:  Physical Exam Constitutional:      General: He is awake. He is not in acute distress.    Appearance: Normal appearance. He is well-developed and normal weight. He is not ill-appearing, toxic-appearing or diaphoretic.     Interventions: He is not intubated. HENT:     Head: Atraumatic. Left periorbital erythema present.     Jaw: Swelling present. No trismus or tenderness.     Nose: Nose  normal.     Mouth/Throat:     Mouth: Mucous membranes are moist. Oral lesions present. No injury or lacerations.     Dentition: Abnormal dentition. Does not have dentures. Dental caries present. No gingival swelling.     Tongue: No lesions.     Palate: No mass and lesions.     Pharynx: Oropharynx is clear. Uvula midline. No pharyngeal swelling, oropharyngeal exudate, posterior oropharyngeal erythema or uvula swelling.     Tonsils: No tonsillar exudate or tonsillar abscesses.  Eyes:     General: Lids are normal. No scleral icterus.    Extraocular Movements: Extraocular movements intact.     Conjunctiva/sclera: Conjunctivae normal.  Neck:     Musculoskeletal: Neck supple.  Cardiovascular:     Rate and Rhythm: Normal rate and regular rhythm.  No extrasystoles are present.    Heart sounds: Normal heart sounds, S1 normal and S2 normal. Heart sounds not distant. No murmur. No friction rub. No gallop. No S3 or S4 sounds.   Pulmonary:     Effort: Pulmonary effort is normal. No tachypnea, bradypnea, accessory muscle  usage, prolonged expiration, respiratory distress or retractions. He is not intubated.     Breath sounds: Normal breath sounds and air entry. No stridor, decreased air movement or transmitted upper airway sounds. No decreased breath sounds, wheezing, rhonchi or rales.  Abdominal:     General: Abdomen is flat. Bowel sounds are decreased. There is no distension.     Palpations: Abdomen is soft.     Tenderness: There is no abdominal tenderness. There is no guarding or rebound.  Musculoskeletal: Normal range of motion.        General: No swelling or tenderness.     Right lower leg: No edema.     Left lower leg: No edema.  Lymphadenopathy:     Cervical: No cervical adenopathy.  Skin:    General: Skin is warm and dry.  Neurological:     General: No focal deficit present.     Mental Status: He is alert and oriented to person, place, and time. Mental status is at baseline.  Psychiatric:         Attention and Perception: Attention and perception normal.        Mood and Affect: Mood and affect normal.        Speech: Speech is slurred.        Behavior: Behavior normal. Behavior is cooperative.        Thought Content: Thought content normal.        Cognition and Memory: Cognition and memory normal.        Judgment: Judgment normal.    Several rotten teeth, poor dentition. Improving L facial swelling/erythema/pain/TTP (L jaw, L cheek inferior to L eye). Speech mildly slurred. LABORATORY PANEL:   CBC Recent Labs  Lab 04/03/18 0324  WBC 8.2  HGB 14.7  HCT 43.5  PLT 228   ------------------------------------------------------------------------------------------------------------------  Chemistries  Recent Labs  Lab 04/03/18 0324  NA 136  K 3.4*  CL 102  CO2 24  GLUCOSE 114*  BUN 21*  CREATININE 0.84  CALCIUM 8.7*   ------------------------------------------------------------------------------------------------------------------  Cardiac Enzymes No results for input(s): TROPONINI in the last 168 hours. ------------------------------------------------------------------------------------------------------------------  RADIOLOGY:  Ct Maxillofacial W Contrast  Result Date: 04/03/2018 CLINICAL DATA:  LEFT dental pain, on antibiotics for 3 weeks. EXAM: CT MAXILLOFACIAL WITH CONTRAST TECHNIQUE: Multidetector CT imaging of the maxillofacial structures was performed with intravenous contrast. Multiplanar CT image reconstructions were also generated. CONTRAST:  35mL OMNIPAQUE IOHEXOL 300 MG/ML  SOLN COMPARISON:  None. FINDINGS: Mild motion degraded examination. OSSEOUS: Linear lucency LEFT anterior maxillary wall, possible nondisplaced fracture. The mandible is intact, the condyles are located. No destructive bony lesions. Tooth 8 and 29 periapical abscess. ORBITS: Ocular globes and orbital contents are normal. SINUSES: Moderate lobulated LEFT maxillary sinus mucosal  thickening with mucoperiosteal reaction in slight atresia. Intact nasal septum is midline. Mastoid aircells are well aerated. SOFT TISSUES: LEFT periorbital, mid to lower face soft tissue swelling with abnormal density LEFT nasal labial fold without abscess. LIMITED INTRACRANIAL: Normal. IMPRESSION: 1. Mild motion degraded examination. 2. LEFT facial cellulitis and suspected nasal labial phlegmon. No abscess. No postseptal extent. 3. Nondisplaced acute versus subacute LEFT anterior maxillary wall fracture, less likely vascular channel. 4. Chronic LEFT maxillary sinusitis. Electronically Signed   By: Elon Alas M.D.   On: 04/03/2018 04:50   IMPRESSION AND PLAN:   A/P: 6M p/w dental infxn, facial + periorbital/preseptal cellulitis, SIRS (-). Hypokalemia, hyperglycemia, hypocalcemia. -Dental infxn, facial + periorbital/preseptal cellulitis: Afebrile, (-) leukocytosis, SIRS (-). CT OMFS (+) "Left facial  cellulitis and suspected nasal labial phlegmon. No abscess. No postseptal extent." Per ENT recs, Unasyn 3g IV q6h x48hr + Decadron 10mg  IV qD (inpt), followed by Augmentin 875mg  PO BID x10-14d (on hospital D/C). Inpatient ENT consult is likely not required. That said, pt will most certainly  benefit from OMFS/dentistry consultation (outpt vs. xfer). Pain ctrl. -Hypokalemia: Replete. Mag level pending -Hypocalcemia: Ionized calcium. -No chronic home medications. -FEN/GI: Regular mechanical soft chopped diet. -DVT PPx: Lovenox. -Code status: Full code. -Disposition: Admission, > 2 midnights. Will benefit from OMFS/dentistry consultation (outpt vs. xfer).   All the records are reviewed and case discussed with ED provider. Management plans discussed with the patient, family and they are in agreement.  CODE STATUS: Full code.  TOTAL TIME TAKING CARE OF THIS PATIENT: 75 minutes.    Arta Silence M.D on 04/03/2018 at 6:00 AM  Between 7am to 6pm - Pager - [phone]  After 6pm go to  [email or web address] - Proofreader  [facility]  Office  [phone]  CC: Primary care physician; Patient, No Pcp Per   Note: This dictation was prepared with Dragon dictation along with smaller phrase technology. Any transcriptional errors that result from this process are unintentional.

## 2018-04-03 NOTE — Clinical Social Work Note (Signed)
Clinical Social Work Assessment  Patient Details  Name: Nicholas Lindsey MRN: [id number] Date of Birth: 1965/10/23  Date of referral:  04/03/18               Reason for consult:  Financial Concerns                Permission sought to share information with:  Case Manager Permission granted to share information::  Yes, Verbal Permission Granted  Name::        Agency::     Relationship::     Contact Information:     Housing/Transportation Living arrangements for the past 2 months:  Single Family Home Source of Information:  Patient Patient Interpreter Needed:  None Criminal Activity/Legal Involvement Pertinent to Current Situation/Hospitalization:  No - Comment as needed Significant Relationships:  Siblings Lives with:  Siblings Do you feel safe going back to the place where you live?  Yes Need for family participation in patient care:  No (Coment)  Care giving concerns:  Patient lives in a 1 bedroom house with his 2 brothers Nicholas Lindsey and Nicholas Lindsey.    Social Worker assessment / plan:  Holiday representative (CSW) received consult for financial concerns. CSW met with patient alone at bedside to offer resources. Patient was alert and oriented X4 and was dressed sitting up in the chair at bedside. CSW introduced self and explained role of CSW department. Per patient he lives in a 1 bedroom house with his 2 brothers and sleeps on the couch. Per patient he has no insurance and does not receive disability income. Patient reported that he was convicted of drug related charges and was in prison for 8 years. Patient reported that he has been out of prison now for 6 years and has a hard time finding steady employment because of his criminal record. 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 Patient reported that he does not drink alcohol or use drugs. Patient reported that he walks places and his brother has a scooter they use to go get groceries. CSW provided emotional support and [place] list including [employer] bus, DSS and [employer] daily meal. RN case manager aware of above and will provide [facility]. Patient reported no other needs or concerns. CSW will continue to follow and assist as needed.   Employment status:  Systems developer information:  Self Pay (Medicaid Pending) PT Recommendations:  Not assessed at this time Information / Referral to community resources:  Other (Comment Required)([place] )  Patient/Family's Response to care:  Patient accepted [employer].   Patient/Family's Understanding of and Emotional Response to Diagnosis, Current Treatment, and Prognosis:  Patient was very pleasant and thanked CSW for visit.   Emotional Assessment Appearance:  Appears stated age Attitude/Demeanor/Rapport:    Affect (typically observed):  Accepting, Adaptable, Pleasant Orientation:  Oriented to Self, Oriented to Place, Oriented to  Time, Oriented to Situation Alcohol / Substance use:  Alcohol Use, Illicit Drugs(histroy of alcohol and drug use. ) Psych involvement (Current and /or in the community):  No (Comment)  Discharge Needs  Concerns to be addressed:  No discharge needs identified Readmission within the last 30 days:  No Current discharge risk:  None Barriers to Discharge:  Continued Medical Work up   [employer], [employer], LCSW 04/03/2018, 10:09 AM

## 2018-04-03 NOTE — Care Management (Signed)
Message sent to [facility] and medication management to check patient status.

## 2018-04-03 NOTE — ED Provider Notes (Signed)
[facility] Emergency Department Provider Note  ____________________________________________   First MD Initiated Contact with Patient 04/03/18 [phone]     (approximate)  I have reviewed the triage vital signs and the nursing notes.   HISTORY  Chief Complaint Dental Pain and Eye Problem    HPI Nicholas Lindsey is a 52 y.o. male with medical history as listed below who presents for evaluation of acute swelling to the left side of his face.  He reports that he has had a lesion or some infection between his 2 front teeth for more than a year and he has been on multiple rounds of antibiotics but it never goes away completely.  He was on antibiotics as recently as about a month ago and he thinks he took most of the medication although he may not have taken the last "3 or 4 of them".  He states that tonight before he went to bed he squeezed the lesion on his lip or gums (it is unclear to which part he is referring) and then he went to sleep.  When he woke up he feels like the left side of his face and neck are swollen all the way up to his eye.  He is speaking somewhat slurred because of the swelling in his face and he has severe pain that is radiating throughout the face as well.  This is never happened to him before so he came right to the emergency department.  He is not having any difficulty speaking or swallowing but his speech is altered because of the swelling of the left side of his face.  He is tolerating secretions and has no pain when he swallows.  He is able to fully open his mouth and is not having any trouble breathing.  He denies fever/chills, chest pain, shortness of breath, nausea, vomiting, abdominal pain, and dysuria.  Nothing particular makes his symptoms better or worse and he describes them as severe.  Past Medical History:  Diagnosis Date  . History of kidney stones   . Left breast abscess 2008   I&D required- [place], [place] (per patient)  . Nodule of  anterior chest wall 01/19/2017    Patient Active Problem List   Diagnosis Date Noted  . Facial cellulitis 04/03/2018  . Nodule of anterior chest wall 01/19/2017    Past Surgical History:  Procedure Laterality Date  . ABCESS DRAINAGE Left 2008   Breast- [place], [place]    Prior to Admission medications   Medication Sig Start Date End Date Taking? Authorizing Provider  acetaminophen (TYLENOL) 325 MG tablet Take 650 mg by mouth every 6 (six) hours as needed.    [provider]  docusate sodium (COLACE) 100 MG capsule Take 1 tablet once or twice daily as needed for constipation while taking narcotic pain medicine 12/02/17   Hinda Kehr, MD  gabapentin (NEURONTIN) 100 MG capsule Take 1 capsule (100 mg total) by mouth 3 (three) times daily. 01/30/17   Nestor Lewandowsky, MD  ibuprofen (ADVIL,MOTRIN) 200 MG tablet Take 200 mg by mouth every 6 (six) hours as needed.    [provider]  ondansetron (ZOFRAN ODT) 4 MG disintegrating tablet Allow 1-2 tablets to dissolve in your mouth every 8 hours as needed for nausea/vomiting 12/02/17   Hinda Kehr, MD  oxyCODONE-acetaminophen (PERCOCET) 5-325 MG tablet Take 1-2 tablets by mouth every 6 (six) hours as needed for severe pain. 12/02/17   Hinda Kehr, MD    Allergies Patient has no known  allergies.  Family History  Problem Relation Age of Onset  . Diabetes Mother   . Hypertension Mother   . Heart attack Father     Social History Social History   Tobacco Use  . Smoking status: Never Smoker  . Smokeless tobacco: Current User    Types: Chew  . Tobacco comment: 1/2 Can chew Daily  Substance Use Topics  . Alcohol use: Yes    Comment: 12 Beers / Weekly  . Drug use: No    Review of Systems Constitutional: No fever/chills Eyes: No visual changes. ENT: Swelling throughout the left side of the face as described above that may have started from an infection in his mouth.  No trismus, no difficulty swallowing. Cardiovascular:  Denies chest pain. Respiratory: Denies shortness of breath. Gastrointestinal: No abdominal pain.  No nausea, no vomiting.  No diarrhea.  No constipation. Genitourinary: Negative for dysuria. Musculoskeletal: Negative for neck pain.  Negative for back pain. Integumentary: Negative for rash. Neurological: Negative for headaches, focal weakness or numbness.   ____________________________________________   PHYSICAL EXAM:  ED Triage Vitals  Enc Vitals Group     BP 04/03/18 0400 (!) 116/93     Pulse Rate 04/03/18 0345 70     Resp 04/03/18 0400 18     Temp 04/03/18 0400 97.7 F (36.5 C)     Temp Source 04/03/18 0400 Oral     SpO2 04/03/18 0345 99 %     Weight 04/03/18 0241 68.9 kg (152 lb)     Height 04/03/18 0241 1.702 m (5\' 7" )     Head Circumference --      Peak Flow --      Pain Score --      Pain Loc --      Pain Edu? --      Excl. in [place]? --      Constitutional: Alert and oriented.  No acute distress. Eyes: Conjunctivae are normal.  Pupils are equal and reactive.  Extraocular motion is intact.  There is no proptosis or exophthalmos. Face/Head: The patient does seem to have edema throughout the left side of his face.  There is some erythema as well and his face is tender to palpation.  There does seem to be some swelling extending up to the infraorbital region but there is no obvious orbital involvement. Nose: No congestion/rhinnorhea. Mouth/Throat: The patient has extensive chronic dental caries but without any obvious acute infection including no brawny induration under the tongue to suggest Ludwig's angina, no peritonsillar abscesses, etc. Neck: No stridor.  No meningeal signs.   Cardiovascular: Normal rate, regular rhythm. Good peripheral circulation. Grossly normal heart sounds. Respiratory: Normal respiratory effort.  No retractions. Lungs CTAB. Gastrointestinal: Soft and nontender. No distention.  Musculoskeletal: No lower extremity tenderness nor edema. No gross  deformities of extremities. Neurologic:  Normal speech and language. No gross focal neurologic deficits are appreciated.  Skin:  Skin is warm, dry and intact. No rash noted. Psychiatric: Mood and affect are normal. Speech and behavior are normal.  ____________________________________________   LABS (all labs ordered are listed, but only abnormal results are displayed)  Labs Reviewed  BASIC METABOLIC PANEL - Abnormal; Notable for the following components:      Result Value   Potassium 3.4 (*)    Glucose, Bld 114 (*)    BUN 21 (*)    Calcium 8.7 (*)    All other components within normal limits  CULTURE, BLOOD (ROUTINE X 2)  CULTURE, BLOOD (ROUTINE X  2)  CBC WITH DIFFERENTIAL/PLATELET  LACTIC ACID, PLASMA  LACTIC ACID, PLASMA  HEPATIC FUNCTION PANEL  MAGNESIUM  PHOSPHORUS  CALCIUM, IONIZED   ____________________________________________  EKG  No indication for EKG ____________________________________________  RADIOLOGY   ED MD interpretation: Left facial cellulitis with possible nasal labial phlegmon but no drainable abscesses.  Two periapical dental abscesses.  Official radiology report(s): Ct Maxillofacial W Contrast  Result Date: 04/03/2018 CLINICAL DATA:  LEFT dental pain, on antibiotics for 3 weeks. EXAM: CT MAXILLOFACIAL WITH CONTRAST TECHNIQUE: Multidetector CT imaging of the maxillofacial structures was performed with intravenous contrast. Multiplanar CT image reconstructions were also generated. CONTRAST:  10mL OMNIPAQUE IOHEXOL 300 MG/ML  SOLN COMPARISON:  None. FINDINGS: Mild motion degraded examination. OSSEOUS: Linear lucency LEFT anterior maxillary wall, possible nondisplaced fracture. The mandible is intact, the condyles are located. No destructive bony lesions. Tooth 8 and 29 periapical abscess. ORBITS: Ocular globes and orbital contents are normal. SINUSES: Moderate lobulated LEFT maxillary sinus mucosal thickening with mucoperiosteal reaction in slight  atresia. Intact nasal septum is midline. Mastoid aircells are well aerated. SOFT TISSUES: LEFT periorbital, mid to lower face soft tissue swelling with abnormal density LEFT nasal labial fold without abscess. LIMITED INTRACRANIAL: Normal. IMPRESSION: 1. Mild motion degraded examination. 2. LEFT facial cellulitis and suspected nasal labial phlegmon. No abscess. No postseptal extent. 3. Nondisplaced acute versus subacute LEFT anterior maxillary wall fracture, less likely vascular channel. 4. Chronic LEFT maxillary sinusitis. Electronically Signed   By: Elon Alas M.D.   On: 04/03/2018 04:50    ____________________________________________   PROCEDURES  Critical Care performed: No   Procedure(s) performed:   Procedures   ____________________________________________   INITIAL IMPRESSION / ASSESSMENT AND PLAN / ED COURSE  As part of my medical decision making, I reviewed the following data within the [facility] notes reviewed and incorporated, Labs reviewed , Old chart reviewed, Discussed with ENT, Discussed with admitting physician  and Notes from prior ED visits    Differential diagnosis includes, but is not limited to, facial cellulitis from odontogenic source, facial abscess, orbital cellulitis, preseptal cellulitis, Ludwick's angina, nonspecific dental abscess.  Given the acute onset of the swelling and the pain after he was squeezing a lesion on his lip or gums, it is certainly possible that he has acute and rapidly spreading facial cellulitis.  I think that an abscess or drainable fluid collection is unlikely.  He is protecting his airway and having no difficulty speaking or swallowing.  The fact that he has had a nonhealing lesion for more than a year and uses chewing tobacco is concerning as well for possible cancer.  I am sending standard lab work for infectious process and will obtain a CT maxillofacial with IV contrast.  I am ordering Unasyn 3 g IV as  well as Decadron 10 mg IV for infection and swelling.  He is getting morphine 4 mg IV and Zofran 4 mg IV for pain and nausea.  His vital signs are stable and he is afebrile.  Disposition pending CT results and lab results.  Clinical Course as of Apr 03 612  Wed Apr 03, 2018  0865 I spoke by phone with Dr. Kathyrn Sheriff regarding the results of the CT scan which demonstrate left-sided facial cellulitis with questionable nasal labial phlegmon but no drainable abscess as well as to periapical dental abscesses.  We discussed the case in detail and he recommended hospital admission with 48 hours of antibiotics and IV Decadron.  His specific recommendations are  Unasyn 3 g IV every 6 hours for 48 hours and Decadron 10 mg IV daily during the hospitalization.  If the swelling and infection seems to have improved significantly in that time period, the patient can be discharged on a 10 to 14-day course of Augmentin 875.  He will need to follow-up with a dentist at that time for additional evaluation and probable dental procedures or extraction.  Dr. Kathyrn Sheriff also pointed out that he is available if needed, but if the patient is improving, there is no indication for additional ENT consult, and I have passed this information along to the hospitalist who will admit.   [CF]    Clinical Course User Index [CF] Hinda Kehr, MD    ____________________________________________  FINAL CLINICAL IMPRESSION(S) / ED DIAGNOSES  Final diagnoses:  Facial cellulitis  Periapical abscess with facial involvement     MEDICATIONS GIVEN DURING THIS VISIT:  Medications  potassium chloride (KLOR-CON) packet 40 mEq (has no administration in time range)  lactated ringers bolus 1,000 mL (has no administration in time range)    Followed by  lactated ringers infusion (has no administration in time range)  morphine 2 MG/ML injection 2 mg (has no administration in time range)    Or  morphine 4 MG/ML injection 4 mg (has no  administration in time range)  Ampicillin-Sulbactam (UNASYN) 3 g in sodium chloride 0.9 % 100 mL IVPB (has no administration in time range)    Followed by  amoxicillin-clavulanate (AUGMENTIN) 875-125 MG per tablet 1 tablet (has no administration in time range)  dexamethasone (DECADRON) injection 10 mg (has no administration in time range)  promethazine (PHENERGAN) injection 12.5 mg (has no administration in time range)  Ampicillin-Sulbactam (UNASYN) 3 g in sodium chloride 0.9 % 100 mL IVPB (0 g Intravenous Stopped 04/03/18 0409)  dexamethasone (DECADRON) injection 10 mg (10 mg Intravenous Given 04/03/18 0331)  morphine 4 MG/ML injection 4 mg (4 mg Intravenous Given 04/03/18 0331)  ondansetron (ZOFRAN) injection 4 mg (4 mg Intravenous Given 04/03/18 0331)  iohexol (OMNIPAQUE) 300 MG/ML solution 75 mL (75 mLs Intravenous Contrast Given 04/03/18 0424)     ED Discharge Orders    None       Note:  This document was prepared using Dragon voice recognition software and may include unintentional dictation errors.    Hinda Kehr, MD 04/03/18 [phone]

## 2018-04-03 NOTE — Progress Notes (Signed)
[place] [facility] [place] NAME: Nicholas Lindsey    MR#:  [id number]  DATE OF BIRTH:  11-28-1965  SUBJECTIVE:  came in with left facial redness pain but he woke up. Has chronic tooth infection has had multiple rounds of antibiotics. Has not been to the dentist  REVIEW OF SYSTEMS:   Review of Systems  Constitutional: Negative for chills, fever and weight loss.  HENT: Negative for ear discharge, ear pain and nosebleeds.        Facial pain  Eyes: Negative for blurred vision, pain and discharge.  Respiratory: Negative for sputum production, shortness of breath, wheezing and stridor.   Cardiovascular: Negative for chest pain, palpitations, orthopnea and PND.  Gastrointestinal: Negative for abdominal pain, diarrhea, nausea and vomiting.  Genitourinary: Negative for frequency and urgency.  Musculoskeletal: Negative for back pain and joint pain.  Neurological: Negative for sensory change, speech change, focal weakness and weakness.  Psychiatric/Behavioral: Negative for depression and hallucinations. The patient is not nervous/anxious.    Tolerating Diet:regular Tolerating PT: does not need  DRUG ALLERGIES:  No Known Allergies  VITALS:  Blood pressure (!) 146/90, pulse 64, temperature 97.7 F (36.5 C), temperature source Oral, resp. rate 15, height 5\' 7"  (1.702 m), weight 68.9 kg, SpO2 100 %.  PHYSICAL EXAMINATION:   Physical Exam  GENERAL:  52 y.o.-year-old patient lying in the bed with no acute distress.  EYES: Pupils equal, round, reactive to light and accommodation. No scleral icterus. Extraocular muscles intact.  HEENT: Head atraumatic, normocephalic. Poor dental hygiene+ mild facial swelling NECK:  Supple, no jugular venous distention. No thyroid enlargement, no tenderness.  LUNGS: Normal breath sounds bilaterally, no wheezing, rales, rhonchi. No use of accessory muscles of respiration.  CARDIOVASCULAR: S1, S2 normal. No murmurs, rubs,  or gallops.  ABDOMEN: Soft, nontender, nondistended. Bowel sounds present. No organomegaly or mass.  EXTREMITIES: No cyanosis, clubbing or edema b/l.    NEUROLOGIC: Cranial nerves II through XII are intact. No focal Motor or sensory deficits b/l.   PSYCHIATRIC:  patient is alert and oriented x 3.  SKIN: No obvious rash, lesion, or ulcer.   LABORATORY PANEL:  CBC Recent Labs  Lab 04/03/18 0324  WBC 8.2  HGB 14.7  HCT 43.5  PLT 228    Chemistries  Recent Labs  Lab 04/03/18 0324 04/03/18 0658  NA 136  --   K 3.4*  --   CL 102  --   CO2 24  --   GLUCOSE 114*  --   BUN 21*  --   CREATININE 0.84  --   CALCIUM 8.7*  --   MG  --  2.4  AST  --  38  ALT  --  53*  ALKPHOS  --  62  BILITOT  --  1.0   Cardiac Enzymes No results for input(s): TROPONINI in the last 168 hours. RADIOLOGY:  Ct Maxillofacial W Contrast  Result Date: 04/03/2018 CLINICAL DATA:  LEFT dental pain, on antibiotics for 3 weeks. EXAM: CT MAXILLOFACIAL WITH CONTRAST TECHNIQUE: Multidetector CT imaging of the maxillofacial structures was performed with intravenous contrast. Multiplanar CT image reconstructions were also generated. CONTRAST:  15mL OMNIPAQUE IOHEXOL 300 MG/ML  SOLN COMPARISON:  None. FINDINGS: Mild motion degraded examination. OSSEOUS: Linear lucency LEFT anterior maxillary wall, possible nondisplaced fracture. The mandible is intact, the condyles are located. No destructive bony lesions. Tooth 8 and 29 periapical abscess. ORBITS: Ocular globes and orbital contents are normal. SINUSES: Moderate lobulated  LEFT maxillary sinus mucosal thickening with mucoperiosteal reaction in slight atresia. Intact nasal septum is midline. Mastoid aircells are well aerated. SOFT TISSUES: LEFT periorbital, mid to lower face soft tissue swelling with abnormal density LEFT nasal labial fold without abscess. LIMITED INTRACRANIAL: Normal. IMPRESSION: 1. Mild motion degraded examination. 2. LEFT facial cellulitis and suspected  nasal labial phlegmon. No abscess. No postseptal extent. 3. Nondisplaced acute versus subacute LEFT anterior maxillary wall fracture, less likely vascular channel. 4. Chronic LEFT maxillary sinusitis. Electronically Signed   By: Elon Alas M.D.   On: 04/03/2018 04:50   ASSESSMENT AND PLAN:  Nicholas Lindsey is a 52 y.o. male with medical history as listed below who presents for evaluation of acute swelling to the left side of his face.  He reports that he has had a lesion or some infection between his 2 front teeth for more than a year and he has been on multiple rounds of antibiotics.  1. Left facial cellulitis secondary to acute on chronic infected tooth according to CT scan maxillofacial with maxillary sinusitis and nondisplaced maxillary fracture. -Patient denies any trauma.-ER physician spoke with ENTdr juengle last night. There is no drain able abscess at present. -Continue IV unison and IV Decadron along with pain meds -patient recommended and given phone number for [facility] dental school/clinic to call and make appointment -white count normal. No fever. Patient tolerating PO diet. No respiratory distress  2. Poor oral hygiene -patient recommended follow-up dentist  3. DVT prophylaxis subcu Lovenox  Case discussed with Care Management/Social Worker. Management plans discussed with the patient, family and they are in agreement.  CODE STATUS: full    TOTAL TIME TAKING CARE OF THIS PATIENT: *25* minutes.  >50% time spent on counselling and coordination of care  POSSIBLE D/C IN ONE DAYS, DEPENDING ON CLINICAL CONDITION.  Note: This dictation was prepared with Dragon dictation along with smaller phrase technology. Any transcriptional errors that result from this process are unintentional.  Fritzi Mandes M.D on 04/03/2018 at 11:43 AM  Between 7am to 6pm - Pager - [phone]  After 6pm go to [email or web address] - password [email or web address] [facility]  Office   [phone]  CC: Primary care physician; Patient, No Pcp PerPatient ID: Nicholas Lindsey, male   DOB: 11/16/1965, 52 y.o.   MRN: [id number]

## 2018-04-03 NOTE — ED Triage Notes (Signed)
Patient arrives with c/o left sided dental pain. Patient report being on antibiotics for place on lip 2-3 weeks ago. Patient reports left sided vision changes and left facial swelling.   BP: 156/112 HR: 89 O2: 99%

## 2018-04-04 MED ORDER — PREDNISONE 50 MG PO TABS
50.0000 mg | ORAL_TABLET | Freq: Every day | ORAL | Status: DC
  Administered 2018-04-04: 50 mg via ORAL
  Filled 2018-04-04: qty 1

## 2018-04-04 MED ORDER — AMOXICILLIN-POT CLAVULANATE 875-125 MG PO TABS: 1.0000 | ORAL_TABLET | Freq: Two times a day (BID) | ORAL | 0 refills | Status: DC

## 2018-04-04 MED ORDER — PREDNISONE 10 MG PO TABS: ORAL_TABLET | ORAL | 0 refills | Status: DC

## 2018-04-04 NOTE — Care Management Note (Signed)
Case Management Note  Patient Details  Name: Nicholas Lindsey MRN: [id number] Date of Birth: 26-Aug-1965  Subjective/Objective:                    Action/Plan: RNCM met with patient to provide application to [facility]) and Medication Management [facility]).  I have faxed to ext [phone] Medication Mgt Augmentin and Prednisone Rx for assistance and left message for both [facility] and [place].  Patient states he lives with a disabled brother and use [employer] for transportation. He said he "would be alright" when I expressed concern about temperature outside (20s) and his clothing. He said "I have a coat; I will be alright. Thank you".   Expected Discharge Date:  04/04/18               Expected Discharge Plan:     In-House Referral:     Discharge planning Services  CM Consult, [facility], Medication Assistance  Post Acute Care Choice:    Choice offered to:  Patient  DME Arranged:    DME Agency:     HH Arranged:    [place] Agency:     Status of Service:  Completed, signed off  If discussed at [employer], dates discussed:    Additional Comments:  Marshell Garfinkel, RN 04/04/2018, 8:21 AM

## 2018-04-04 NOTE — Discharge Instructions (Signed)
Pt given number for [facility] dental school for f/u tooth issues

## 2018-04-04 NOTE — Discharge Summary (Addendum)
Nicholas Lindsey NAME: Nicholas Lindsey    MR#:  [id number]  DATE OF BIRTH:  06-30-1965  DATE OF ADMISSION:  04/03/2018 ADMITTING PHYSICIAN: Arta Silence, MD  DATE OF DISCHARGE: 04/04/2018  PRIMARY CARE PHYSICIAN: Patient, No Pcp Per    ADMISSION DIAGNOSIS:  Facial cellulitis [O84.166] Periapical abscess with facial involvement [K04.7]  DISCHARGE DIAGNOSIS:  Left Facial cellulitis due to infected tooth (chronic)  SECONDARY DIAGNOSIS:   Past Medical History:  Diagnosis Date  . History of kidney stones   . Left breast abscess 2008   I&D required- [place] (per patient)  . Nodule of anterior chest wall 01/19/2017    HOSPITAL COURSE:  Nicholas Lindsey a 52 y.o.malewith medical history as listed below who presents for evaluation of acute swelling to the left side of his face. He reports that he has had a lesion or some infection between his 2 front teeth for more than a year and he has been on multiple rounds of antibiotics.  1. Left facial cellulitis secondary to acute on chronic infected tooth according to CT scan maxillofacial with maxillary sinusitis and nondisplaced maxillary fracture. -Patient denies any trauma.-ER physician spoke with ENTdr juengle last night. There is no drain able abscess at present. -Continue IV unasyn and IV Decadron along with pain meds--change to oral abxs and prednisone -patient recommended and given phone number for [facility] to call and make appointment -white count normal. No fever. Patient tolerating PO diet. No respiratory distress  2. Poor oral hygiene -patient recommended follow-up dentist  3. DVT prophylaxis subcu Lovenox  D/c home CONSULTS OBTAINED:  Treatment Team:  Arta Silence, MD  DRUG ALLERGIES:  No Known Allergies  DISCHARGE MEDICATIONS:   Allergies as of 04/04/2018   No Known Allergies     Medication List    STOP taking these  medications   gabapentin 100 MG capsule Commonly known as:  NEURONTIN   ondansetron 4 MG disintegrating tablet Commonly known as:  ZOFRAN ODT   oxyCODONE-acetaminophen 5-325 MG tablet Commonly known as:  PERCOCET     TAKE these medications   acetaminophen 325 MG tablet Commonly known as:  TYLENOL Take 650 mg by mouth every 6 (six) hours as needed.   amoxicillin-clavulanate 875-125 MG tablet Commonly known as:  AUGMENTIN Take 1 tablet by mouth every 12 (twelve) hours. Start taking on:  April 05, 2018   docusate sodium 100 MG capsule Commonly known as:  COLACE Take 1 tablet once or twice daily as needed for constipation while taking narcotic pain medicine   ibuprofen 200 MG tablet Commonly known as:  ADVIL,MOTRIN Take 200 mg by mouth every 6 (six) hours as needed.   predniSONE 10 MG tablet Commonly known as:  DELTASONE Take 50 mg daily taper by 10 mg daily then stop       If you experience worsening of your admission symptoms, develop shortness of breath, life threatening emergency, suicidal or homicidal thoughts you must seek medical attention immediately by calling 911 or calling your MD immediately  if symptoms less severe.  You Must read complete instructions/literature along with all the possible adverse reactions/side effects for all the Medicines you take and that have been prescribed to you. Take any new Medicines after you have completely understood and accept all the possible adverse reactions/side effects.   Please note  You were cared for by a hospitalist during your hospital stay. If you have any questions about your  discharge medications or the care you received while you were in the hospital after you are discharged, you can call the unit and asked to speak with the hospitalist on call if the hospitalist that took care of you is not available. Once you are discharged, your primary care physician will handle any further medical issues. Please note that NO  REFILLS for any discharge medications will be authorized once you are discharged, as it is imperative that you return to your primary care physician (or establish a relationship with a primary care physician if you do not have one) for your aftercare needs so that they can reassess your need for medications and monitor your lab values. Today   SUBJECTIVE  Overall improving with facial swelling   VITAL SIGNS:  Blood pressure 118/75, pulse 73, temperature 97.6 F (36.4 C), temperature source Oral, resp. rate 18, height 5\' 7"  (1.702 m), weight 68.9 kg, SpO2 98 %.  I/O:    Intake/Output Summary (Last 24 hours) at 04/04/2018 0804 Last data filed at 04/04/2018 0503 Gross per 24 hour  Intake 1500 ml  Output -  Net 1500 ml    PHYSICAL EXAMINATION:  GENERAL:  52 y.o.-year-old patient lying in the bed with no acute distress.  EYES: Pupils equal, round, reactive to light and accommodation. No scleral icterus. Extraocular muscles intact.  HEENT: Head atraumatic, normocephalic. Oropharynx and nasopharynx clear. Left facial swelling better--poor oral hygiene NECK:  Supple, no jugular venous distention. No thyroid enlargement, no tenderness.  LUNGS: Normal breath sounds bilaterally, no wheezing, rales,rhonchi or crepitation. No use of accessory muscles of respiration.  CARDIOVASCULAR: S1, S2 normal. No murmurs, rubs, or gallops.  ABDOMEN: Soft, non-tender, non-distended. Bowel sounds present. No organomegaly or mass.  EXTREMITIES: No pedal edema, cyanosis, or clubbing.  NEUROLOGIC: Cranial nerves II through XII are intact. Muscle strength 5/5 in all extremities. Sensation intact. Gait not checked.  PSYCHIATRIC: The patient is alert and oriented x 3.  SKIN: No obvious rash, lesion, or ulcer.   DATA REVIEW:   CBC  Recent Labs  Lab 04/03/18 0324  WBC 8.2  HGB 14.7  HCT 43.5  PLT 228    Chemistries  Recent Labs  Lab 04/03/18 0324 04/03/18 0658  NA 136  --   K 3.4*  --   CL 102  --    CO2 24  --   GLUCOSE 114*  --   BUN 21*  --   CREATININE 0.84  --   CALCIUM 8.7*  --   MG  --  2.4  AST  --  38  ALT  --  53*  ALKPHOS  --  62  BILITOT  --  1.0    Microbiology Results   Recent Results (from the past 240 hour(s))  Blood culture (routine x 2)     Status: None (Preliminary result)   Collection Time: 04/03/18  3:24 AM  Result Value Ref Range Status   Specimen Description BLOOD RIGHT FOREARM  Final   Special Requests   Final    BOTTLES DRAWN AEROBIC AND ANAEROBIC Blood Culture adequate volume   Culture   Final    NO GROWTH 1 DAY Performed at [facility], [address] [place], [place] [address]    Report Status PENDING  Incomplete  Blood culture (routine x 2)     Status: None (Preliminary result)   Collection Time: 04/03/18  3:24 AM  Result Value Ref Range Status   Specimen Description BLOOD RIGHT ANTECUBITAL  Final  Special Requests   Final    BOTTLES DRAWN AEROBIC AND ANAEROBIC Blood Culture adequate volume   Culture   Final    NO GROWTH 1 DAY Performed at [facility], [address] [place], [place] [address]    Report Status PENDING  Incomplete    RADIOLOGY:  Ct Maxillofacial W Contrast  Result Date: 04/03/2018 CLINICAL DATA:  LEFT dental pain, on antibiotics for 3 weeks. EXAM: CT MAXILLOFACIAL WITH CONTRAST TECHNIQUE: Multidetector CT imaging of the maxillofacial structures was performed with intravenous contrast. Multiplanar CT image reconstructions were also generated. CONTRAST:  76mL OMNIPAQUE IOHEXOL 300 MG/ML  SOLN COMPARISON:  None. FINDINGS: Mild motion degraded examination. OSSEOUS: Linear lucency LEFT anterior maxillary wall, possible nondisplaced fracture. The mandible is intact, the condyles are located. No destructive bony lesions. Tooth 8 and 29 periapical abscess. ORBITS: Ocular globes and orbital contents are normal. SINUSES: Moderate lobulated LEFT maxillary sinus mucosal thickening with mucoperiosteal reaction  in slight atresia. Intact nasal septum is midline. Mastoid aircells are well aerated. SOFT TISSUES: LEFT periorbital, mid to lower face soft tissue swelling with abnormal density LEFT nasal labial fold without abscess. LIMITED INTRACRANIAL: Normal. IMPRESSION: 1. Mild motion degraded examination. 2. LEFT facial cellulitis and suspected nasal labial phlegmon. No abscess. No postseptal extent. 3. Nondisplaced acute versus subacute LEFT anterior maxillary wall fracture, less likely vascular channel. 4. Chronic LEFT maxillary sinusitis. Electronically Signed   By: Elon Alas M.D.   On: 04/03/2018 04:50    CODE STATUS:     Code Status Orders  (From admission, onward)         Start     Ordered   04/03/18 0640  Full code  Continuous     04/03/18 0640        Code Status History    This patient has a current code status but no historical code status.      TOTAL TIME TAKING CARE OF THIS PATIENT: *50 minutes.    Fritzi Mandes M.D on 04/04/2018 at 8:04 AM  Between 7am to 6pm - Pager - [phone] After 6pm go to [email or web address] - password [email or web address] [facility]  Office  [phone]  CC: Primary care physician; Patient, No Pcp Per

## 2018-04-05 LAB — CALCIUM, IONIZED: CALCIUM, IONIZED, SERUM: 4.7 mg/dL (ref 4.5–5.6)

## 2018-04-08 LAB — CULTURE, BLOOD (ROUTINE X 2)
Culture: NO GROWTH
Culture: NO GROWTH
Special Requests: ADEQUATE
Special Requests: ADEQUATE

## 2018-04-12 ENCOUNTER — Telehealth: Payer: Self-pay

## 2018-04-12 NOTE — Telephone Encounter (Signed)
Tried calling pt to establish primary care at [facility]. Patient does not have voicemail set up to leave a message.

## 2018-04-19 ENCOUNTER — Encounter: Payer: Self-pay | Admitting: Emergency Medicine

## 2018-04-19 ENCOUNTER — Other Ambulatory Visit: Payer: Self-pay

## 2018-04-19 ENCOUNTER — Emergency Department
Admission: EM | Admit: 2018-04-19 | Discharge: 2018-04-19 | Disposition: A | Discharge status: Home / Self Care | Payer: Self-pay | Attending: Emergency Medicine | Admitting: Emergency Medicine

## 2018-04-19 DIAGNOSIS — Z79899 Other long term (current) drug therapy: Secondary | ICD-10-CM | POA: Insufficient documentation

## 2018-04-19 DIAGNOSIS — F329 Major depressive disorder, single episode, unspecified: Secondary | ICD-10-CM | POA: Insufficient documentation

## 2018-04-19 DIAGNOSIS — R454 Irritability and anger: Secondary | ICD-10-CM | POA: Insufficient documentation
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
## 2018-04-19 NOTE — Discharge Instructions (Signed)
Fortunately today you do not need to be admitted to the hospital for any medical or psychiatric reasons.  Please follow-up with a therapist as well as a primary care physician as needed and return to the emergency department for any concerns.  It was a pleasure to take care of you today, and thank you for coming to our emergency department.  If you have any questions or concerns before leaving please ask the nurse to grab me and I'm more than happy to go through your aftercare instructions again.  If you have any concerns once you are home that you are not improving or are in fact getting worse before you can make it to your follow-up appointment, please do not hesitate to call 911 and come back for further evaluation.  Darel Hong, MD
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
## 2018-04-19 NOTE — ED Notes (Signed)
Pt. Transferred from Triage to room after dressing out and screening for contraband. Report to include Situation, Background, Assessment and Recommendations from [employer]. Pt. Oriented to Quad including Q15 minute rounds as well as Engineer, drilling for their protection. Patient is alert and oriented, warm and dry in no acute distress. Patient denies SI, HI, and AVH.  Pt. Encouraged to let me know if needs arise.

## 2019-01-11 ENCOUNTER — Other Ambulatory Visit: Payer: Self-pay

## 2019-01-11 ENCOUNTER — Emergency Department: Payer: Self-pay

## 2019-01-11 ENCOUNTER — Emergency Department
Admission: EM | Admit: 2019-01-11 | Discharge: 2019-01-11 | Disposition: A | Discharge status: Home / Self Care | Payer: Self-pay | Attending: Student | Admitting: Student

## 2019-01-11 DIAGNOSIS — N410 Acute prostatitis: Secondary | ICD-10-CM | POA: Insufficient documentation

## 2019-01-11 DIAGNOSIS — F17220 Nicotine dependence, chewing tobacco, uncomplicated: Secondary | ICD-10-CM | POA: Insufficient documentation

## 2019-01-11 DIAGNOSIS — F17290 Nicotine dependence, other tobacco product, uncomplicated: Secondary | ICD-10-CM | POA: Insufficient documentation

## 2019-01-11 DIAGNOSIS — N50819 Testicular pain, unspecified: Secondary | ICD-10-CM

## 2019-01-11 DIAGNOSIS — N50811 Right testicular pain: Secondary | ICD-10-CM | POA: Insufficient documentation

## 2019-01-11 LAB — COMPREHENSIVE METABOLIC PANEL
ALT: 33 U/L (ref 0–44)
AST: 24 U/L (ref 15–41)
Albumin: 3.7 g/dL (ref 3.5–5.0)
Alkaline Phosphatase: 58 U/L (ref 38–126)
Anion gap: 9 (ref 5–15)
BUN: 13 mg/dL (ref 6–20)
CO2: 23 mmol/L (ref 22–32)
Calcium: 8.7 mg/dL — ABNORMAL LOW (ref 8.9–10.3)
Chloride: 100 mmol/L (ref 98–111)
Creatinine, Ser: 0.71 mg/dL (ref 0.61–1.24)
GFR calc Af Amer: >60 mL/min (ref >60)
GFR calc non Af Amer: >60 mL/min (ref >60)
Glucose, Bld: 116 mg/dL — ABNORMAL HIGH (ref 70–99)
Potassium: 3.7 mmol/L (ref 3.5–5.1)
Sodium: 132 mmol/L — ABNORMAL LOW (ref 135–145)
Total Bilirubin: 1.7 mg/dL — ABNORMAL HIGH (ref 0.3–1.2)
Total Protein: 7.7 g/dL (ref 6.5–8.1)

## 2019-01-11 LAB — CBC
HCT: 43.5 % (ref 39.0–52.0)
Hemoglobin: 15.1 g/dL (ref 13.0–17.0)
MCH: 30.8 pg (ref 26.0–34.0)
MCHC: 34.7 g/dL (ref 30.0–36.0)
MCV: 88.8 fL (ref 80.0–100.0)
Platelets: 241 10*3/uL (ref 150–400)
RBC: 4.9 MIL/uL (ref 4.22–5.81)
RDW: 13.1 % (ref 11.5–15.5)
WBC: 31.2 10*3/uL — ABNORMAL HIGH (ref 4.0–10.5)
nRBC: 0 % (ref 0.0–0.2)

## 2019-01-11 LAB — URINE DRUG SCREEN, QUALITATIVE (ARMC ONLY)
Amphetamines, Ur Screen: POSITIVE — AB
Barbiturates, Ur Screen: NOT DETECTED
Benzodiazepine, Ur Scrn: NOT DETECTED
Cannabinoid 50 Ng, Ur ~~LOC~~: NOT DETECTED
Cocaine Metabolite,Ur ~~LOC~~: NOT DETECTED
MDMA (Ecstasy)Ur Screen: NOT DETECTED
Methadone Scn, Ur: NOT DETECTED
Opiate, Ur Screen: NOT DETECTED
Phencyclidine (PCP) Ur S: NOT DETECTED
Tricyclic, Ur Screen: NOT DETECTED

## 2019-01-11 LAB — URINALYSIS, COMPLETE (UACMP) WITH MICROSCOPIC
Bilirubin Urine: NEGATIVE
Glucose, UA: NEGATIVE mg/dL
Ketones, ur: 20 mg/dL — AB
Nitrite: POSITIVE — AB
Protein, ur: 30 mg/dL — AB
Specific Gravity, Urine: 1.019 (ref 1.005–1.030)
WBC, UA: >50 WBC/hpf — ABNORMAL HIGH (ref 0–5)
pH: 5 (ref 5.0–8.0)

## 2019-01-11 LAB — LACTIC ACID, PLASMA: Lactic Acid, Venous: 1 mmol/L (ref 0.5–1.9)

## 2019-01-11 LAB — LIPASE, BLOOD: Lipase: 17 U/L (ref 11–51)

## 2019-01-11 MED ORDER — MELOXICAM 15 MG PO TABS: 15.0000 mg | ORAL_TABLET | Freq: Every day | ORAL | 0 refills | Status: DC

## 2019-01-11 MED ORDER — SODIUM CHLORIDE 0.9 % IV BOLUS
1000.0000 mL | Freq: Once | INTRAVENOUS | Status: AC
  Administered 2019-01-11: 1000 mL via INTRAVENOUS

## 2019-01-11 MED ORDER — SODIUM CHLORIDE 0.9 % IV SOLN
1.0000 g | Freq: Once | INTRAVENOUS | Status: AC
  Administered 2019-01-11: 1 g via INTRAVENOUS
  Filled 2019-01-11: qty 10

## 2019-01-11 MED ORDER — MORPHINE SULFATE (PF) 4 MG/ML IV SOLN
4.0000 mg | Freq: Once | INTRAVENOUS | Status: AC
  Administered 2019-01-11: 4 mg via INTRAVENOUS
  Filled 2019-01-11: qty 1

## 2019-01-11 MED ORDER — SULFAMETHOXAZOLE-TRIMETHOPRIM 800-160 MG PO TABS
1.0000 | ORAL_TABLET | Freq: Once | ORAL | Status: AC
  Administered 2019-01-11: 1 via ORAL
  Filled 2019-01-11: qty 1

## 2019-01-11 MED ORDER — AZITHROMYCIN 500 MG PO TABS
1000.0000 mg | ORAL_TABLET | Freq: Once | ORAL | Status: AC
  Administered 2019-01-11: 1000 mg via ORAL
  Filled 2019-01-11: qty 2

## 2019-01-11 MED ORDER — ONDANSETRON HCL 4 MG/2ML IJ SOLN
4.0000 mg | Freq: Once | INTRAMUSCULAR | Status: AC
  Administered 2019-01-11: 4 mg via INTRAVENOUS
  Filled 2019-01-11: qty 2

## 2019-01-11 MED ORDER — SULFAMETHOXAZOLE-TRIMETHOPRIM 800-160 MG PO TABS: 1.0000 | ORAL_TABLET | Freq: Two times a day (BID) | ORAL | 0 refills | Status: AC

## 2019-01-11 MED ORDER — SODIUM CHLORIDE 0.9% FLUSH: 3.0000 mL | Freq: Once | INTRAVENOUS | Status: DC

## 2019-01-11 NOTE — ED Triage Notes (Signed)
Pt arrived via [facility] with report of abdominal/testicle pain. Pt states it feels as though his testicle has gone up inside him. Pain has been present for 4 days, but recently worsened. No reported BM for 4 days prior, no N/V reported. Some alteration in urination, pt stated pus. BP 145/87, HR 102

## 2019-01-11 NOTE — ED Notes (Signed)
Patient resting quietly with eyes closed. No acute distress noted.

## 2019-01-11 NOTE — ED Provider Notes (Signed)
[facility] Emergency Department Provider Note  ____________________________________________  Time seen: Approximately 5:30 PM  I have reviewed the triage vital signs and the nursing notes.   HISTORY  Chief Complaint Abdominal Pain    HPI Nicholas Lindsey is a 53 y.o. male who presents the emergency department complaining of testicular pain.   Initially, patient was complaining of right testicular pain and when asked with radiation he states that it radiated into his groin.  When clarifying this patient states that it was his left testicle radiating into his left groin.  Patient denied any frank abdominal pain.  He has had some dysuria but denies any penile discharge or penile lesions.  Patient does endorse some constipation but denies any nausea or vomiting.  No fevers or chills.  No URI symptoms.  No trauma to the groin.  Patient denies any other complaints at this time.  He states that symptoms have been ongoing x4 days both with regards to constipation as well as testicular pain.  No medications prior to arrival.        Past Medical History:  Diagnosis Date  . History of kidney stones   . Left breast abscess 2008   I&D required- [place], [place] (per patient)  . Nodule of anterior chest wall 01/19/2017    Patient Active Problem List   Diagnosis Date Noted  . Facial cellulitis 04/03/2018  . Nodule of anterior chest wall 01/19/2017    Past Surgical History:  Procedure Laterality Date  . ABCESS DRAINAGE Left 2008   Breast- [place], [place]    Prior to Admission medications   Medication Sig Start Date End Date Taking? Authorizing Provider  acetaminophen (TYLENOL) 325 MG tablet Take 650 mg by mouth every 6 (six) hours as needed.    [provider]  amoxicillin-clavulanate (AUGMENTIN) 875-125 MG tablet Take 1 tablet by mouth every 12 (twelve) hours. 04/05/18   Fritzi Mandes, MD  docusate sodium (COLACE) 100 MG capsule Take 1 tablet once or twice daily  as needed for constipation while taking narcotic pain medicine 12/02/17   Hinda Kehr, MD  ibuprofen (ADVIL,MOTRIN) 200 MG tablet Take 200 mg by mouth every 6 (six) hours as needed.    [provider]  meloxicam (MOBIC) 15 MG tablet Take 1 tablet (15 mg total) by mouth daily. 01/11/19   Asar Evilsizer, Charline Bills, PA-C  predniSONE (DELTASONE) 10 MG tablet Take 50 mg daily taper by 10 mg daily then stop 04/04/18   Fritzi Mandes, MD  sulfamethoxazole-trimethoprim (BACTRIM DS) 800-160 MG tablet Take 1 tablet by mouth 2 (two) times daily. 01/11/19 02/22/19  Beva Remund, Charline Bills, PA-C    Allergies Patient has no known allergies.  Family History  Problem Relation Age of Onset  . Diabetes Mother   . Hypertension Mother   . Heart attack Father     Social History Social History   Tobacco Use  . Smoking status: Never Smoker  . Smokeless tobacco: Current User    Types: Chew  . Tobacco comment: 1/2 Can chew Daily  Substance Use Topics  . Alcohol use: Not Currently    Comment: 12 Beers / Weekly  . Drug use: No     Review of Systems  Constitutional: No fever/chills Eyes: No visual changes. No discharge ENT: No upper respiratory complaints. Cardiovascular: no chest pain. Respiratory: no cough. No SOB. Gastrointestinal: No abdominal pain.  No nausea, no vomiting.  No diarrhea.  Positive constipation. Genitourinary: Positive for dysuria.  No penile discharge.  No  penile lesions.  No hematuria.  Positive for testicular pain, patient endorses initially pain to the right, the left testicle radiating into the groin. Musculoskeletal: Negative for musculoskeletal pain. Skin: Negative for rash, abrasions, lacerations, ecchymosis. Neurological: Negative for headaches, focal weakness or numbness. 10-point ROS otherwise negative.  ____________________________________________   PHYSICAL EXAM:  VITAL SIGNS: ED Triage Vitals  Enc Vitals Group     BP 01/11/19 1659 (!) 150/83     Pulse Rate  01/11/19 1659 95     Resp 01/11/19 1659 20     Temp 01/11/19 1659 98.2 F (36.8 C)     Temp Source 01/11/19 1659 Oral     SpO2 01/11/19 1659 97 %     Weight 01/11/19 1704 145 lb (65.8 kg)     Height 01/11/19 1704 5\' 7"  (1.702 m)     Head Circumference --      Peak Flow --      Pain Score 01/11/19 1702 10     Pain Loc --      Pain Edu? --      Excl. in [place]? --      Constitutional: Alert and oriented. Well appearing and in no acute distress. Eyes: Conjunctivae are normal. PERRL. EOMI. Head: Atraumatic. ENT:      Ears:       Nose: No congestion/rhinnorhea.      Mouth/Throat: Mucous membranes are moist.  Neck: No stridor.    Cardiovascular: Normal rate, regular rhythm. Normal S1 and S2.  Good peripheral circulation. Respiratory: Normal respiratory effort without tachypnea or retractions. Lungs CTAB. Good air entry to the bases with no decreased or absent breath sounds. Gastrointestinal: Bowel sounds 4 quadrants. Soft and nontender to palpation. No guarding or rigidity. No palpable masses. No distention. No CVA tenderness. Genitourinary: No visible abnormality about the scrotum.  No visible external lesions.  No purulent drainage identified at this time.  Patient is very tender palpation of over bilateral testicles, it appears that patient is more tender to the left testicle.  Patient is tender to palpation in the left inguinal region with no palpable abnormality in the inguinal canal. Musculoskeletal: Full range of motion to all extremities. No gross deformities appreciated. Neurologic:  Normal speech and language. No gross focal neurologic deficits are appreciated.  Skin:  Skin is warm, dry and intact. No rash noted. Psychiatric: Mood and affect are normal. Speech and behavior are normal. Patient exhibits appropriate insight and judgement.   ____________________________________________   LABS (all labs ordered are listed, but only abnormal results are displayed)  Labs Reviewed   COMPREHENSIVE METABOLIC PANEL - Abnormal; Notable for the following components:      Result Value   Sodium 132 (*)    Glucose, Bld 116 (*)    Calcium 8.7 (*)    Total Bilirubin 1.7 (*)    All other components within normal limits  CBC - Abnormal; Notable for the following components:   WBC 31.2 (*)    All other components within normal limits  URINALYSIS, COMPLETE (UACMP) WITH MICROSCOPIC - Abnormal; Notable for the following components:   Color, Urine YELLOW (*)    APPearance CLOUDY (*)    Hgb urine dipstick MODERATE (*)    Ketones, ur 20 (*)    Protein, ur 30 (*)    Nitrite POSITIVE (*)    Leukocytes,Ua LARGE (*)    WBC, UA >50 (*)    Bacteria, UA MANY (*)    All other components within normal limits  URINE  DRUG SCREEN, QUALITATIVE ([facility] ONLY) - Abnormal; Notable for the following components:   Amphetamines, Ur Screen POSITIVE (*)    All other components within normal limits  URINE CULTURE  LIPASE, BLOOD  LACTIC ACID, PLASMA   ____________________________________________  EKG  ED ECG REPORT I, Charline Bills Dodie Parisi,  personally viewed and interpreted this ECG.   Date: 01/11/2019  EKG Time: 1716 hrs.  Rate: 91 bpm  Rhythm: normal EKG, normal sinus rhythm, unchanged from previous tracings  Axis: Normal axis  Intervals:none  ST&T Change: No ST elevation or depression noted  Normal sinus rhythm.  No STEMI.  ____________________________________________  RADIOLOGY I personally viewed and evaluated these images as part of my medical decision making, as well as reviewing the written report by the radiologist.  Dg Abdomen 1 View  Result Date: 01/11/2019 CLINICAL DATA:  53 year old male with history of testicular pain. No bowel movement in the past 5 days. EXAM: ABDOMEN - 1 VIEW COMPARISON:  No priors. FINDINGS: Gas and stool are seen scattered throughout the colon extending to the level of the distal rectum. Large amount of stool in the colon. No pathologic distension of  small bowel is noted. No gross evidence of pneumoperitoneum. IMPRESSION: 1. Large amount of stool throughout the colon compatible with reported clinical history of constipation. 2. Nonobstructive bowel gas pattern. 3. No pneumoperitoneum. Electronically Signed   By: Vinnie Langton M.D.   On: 01/11/2019 17:49   US Scrotum W/doppler  Result Date: 01/11/2019 CLINICAL DATA:  53 year old male with history of testicular pain for the past 4 days. EXAM: SCROTAL ULTRASOUND DOPPLER ULTRASOUND OF THE TESTICLES TECHNIQUE: Complete ultrasound examination of the testicles, epididymis, and other scrotal structures was performed. Color and spectral Doppler ultrasound were also utilized to evaluate blood flow to the testicles. COMPARISON:  None. FINDINGS: Right testicle Measurements: 3.0 x 2.5 x 2.6 cm. No mass or microlithiasis visualized. Left testicle Measurements: 2.7 x 3.6 x 2.9 cm. No mass or microlithiasis visualized. Right epididymis:  Normal in size and appearance. Left epididymis:  Normal in size and appearance. Hydrocele:  Small bilateral hydroceles (right greater than left). Varicocele:  None visualized. Pulsed Doppler interrogation of both testes demonstrates normal low resistance arterial and venous waveforms bilaterally. IMPRESSION: 1. Normal sonographic appearance of the testicles and epididymides bilaterally. No evidence of testicular torsion. 2. Small bilateral hydroceles (right greater than left). Electronically Signed   By: Vinnie Langton M.D.   On: 01/11/2019 18:18    ____________________________________________    PROCEDURES  Procedure(s) performed:    Procedures    Medications  sodium chloride flush (NS) 0.9 % injection 3 mL (has no administration in time range)  morphine 4 MG/ML injection 4 mg (4 mg Intravenous Given 01/11/19 1813)  ondansetron (ZOFRAN) injection 4 mg (4 mg Intravenous Given 01/11/19 1812)  sodium chloride 0.9 % bolus 1,000 mL (0 mLs Intravenous Stopped 01/11/19 1930)   cefTRIAXone (ROCEPHIN) 1 g in sodium chloride 0.9 % 100 mL IVPB (0 g Intravenous Stopped 01/11/19 2024)  azithromycin (ZITHROMAX) tablet 1,000 mg (1,000 mg Oral Given 01/11/19 1927)  sulfamethoxazole-trimethoprim (BACTRIM DS) 800-160 MG per tablet 1 tablet (1 tablet Oral Given 01/11/19 1927)     ____________________________________________   INITIAL IMPRESSION / ASSESSMENT AND PLAN / ED COURSE  Pertinent labs & imaging results that were available during my care of the patient were reviewed by me and considered in my medical decision making (see chart for details).  Review of the [place] CSRS was performed in accordance of the  [facility] prior to dispensing any controlled drugs.           Patient's diagnosis is consistent with testicle pain, prostatitis.  Patient presented to the emergency department complaining of testicular pain.  There was some confusion whether it was the right or left testicle.  Patient gave responses indicating both.  Patient did have mild retraction of the left testicle compared with right.  Otherwise exam was reassuring with no CVA tenderness or tenderness to palpation of the abdomen.  Patient had mild tenderness to palpation along the inguinal region and the perineal region.  Labs came back positive for nitrates, white blood cells, bacteria.  Ultrasound with no evidence of orchitis, epididymitis.  Given patient's reported symptoms, he will be treated with Rocephin, Zithromax here in the emergency department as well as a first dose of Bactrim.  I suspect based off of patient's elevated white blood cell count, pain, urine findings as well as the patient's reported history of previous problems with his prostate that these are likely prostatitis.  Patient did have an elevated white blood cell count of 31,000 but lactic was not elevated.  Patient exhibits no signs of sepsis.  Patient is a good candidate for oral antibiotics at home.  Urine culture is ordered at this time.  Patient will be  prescribed 6 weeks of Bactrim for prostatitis.  Anti-inflammatory meloxicam for pain relief.  Follow-up with primary care or urology..Patient is given ED precautions to return to the ED for any worsening or new symptoms.     ____________________________________________  FINAL CLINICAL IMPRESSION(S) / ED DIAGNOSES  Final diagnoses:  Testicle pain  Acute prostatitis      NEW MEDICATIONS STARTED DURING THIS VISIT:  ED Discharge Orders         Ordered    sulfamethoxazole-trimethoprim (BACTRIM DS) 800-160 MG tablet  2 times daily     01/11/19 1928    meloxicam (MOBIC) 15 MG tablet  Daily     01/11/19 1929              This chart was dictated using voice recognition software/Dragon. Despite best efforts to proofread, errors can occur which can change the meaning. Any change was purely unintentional.    Darletta Moll, PA-C 01/11/19 2036    Lilia Pro., MD 01/13/19 [phone]

## 2019-03-17 ENCOUNTER — Other Ambulatory Visit: Payer: Self-pay

## 2019-03-17 ENCOUNTER — Emergency Department
Admission: EM | Admit: 2019-03-17 | Discharge: 2019-03-18 | Disposition: A | Discharge status: Home / Self Care | Payer: Self-pay | Attending: Emergency Medicine | Admitting: Emergency Medicine

## 2019-03-17 DIAGNOSIS — R4689 Other symptoms and signs involving appearance and behavior: Secondary | ICD-10-CM | POA: Diagnosis present

## 2019-03-17 DIAGNOSIS — F319 Bipolar disorder, unspecified: Secondary | ICD-10-CM | POA: Diagnosis present

## 2019-03-17 DIAGNOSIS — Z20828 Contact with and (suspected) exposure to other viral communicable diseases: Secondary | ICD-10-CM | POA: Insufficient documentation

## 2019-03-17 DIAGNOSIS — R456 Violent behavior: Secondary | ICD-10-CM | POA: Insufficient documentation

## 2019-03-17 DIAGNOSIS — F1510 Other stimulant abuse, uncomplicated: Secondary | ICD-10-CM | POA: Insufficient documentation

## 2019-03-17 DIAGNOSIS — F17220 Nicotine dependence, chewing tobacco, uncomplicated: Secondary | ICD-10-CM | POA: Insufficient documentation

## 2019-03-17 LAB — ETHANOL: Alcohol, Ethyl (B): <10 mg/dL (ref <10)

## 2019-03-17 LAB — CBC
HCT: 43.5 % (ref 39.0–52.0)
Hemoglobin: 14.4 g/dL (ref 13.0–17.0)
MCH: 29.7 pg (ref 26.0–34.0)
MCHC: 33.1 g/dL (ref 30.0–36.0)
MCV: 89.7 fL (ref 80.0–100.0)
Platelets: 261 10*3/uL (ref 150–400)
RBC: 4.85 MIL/uL (ref 4.22–5.81)
RDW: 13.7 % (ref 11.5–15.5)
WBC: 6.6 10*3/uL (ref 4.0–10.5)
nRBC: 0 % (ref 0.0–0.2)

## 2019-03-17 LAB — COMPREHENSIVE METABOLIC PANEL
ALT: 38 U/L (ref 0–44)
AST: 32 U/L (ref 15–41)
Albumin: 3.5 g/dL (ref 3.5–5.0)
Alkaline Phosphatase: 85 U/L (ref 38–126)
Anion gap: 7 (ref 5–15)
BUN: 10 mg/dL (ref 6–20)
CO2: 26 mmol/L (ref 22–32)
Calcium: 9.1 mg/dL (ref 8.9–10.3)
Chloride: 105 mmol/L (ref 98–111)
Creatinine, Ser: 0.6 mg/dL — ABNORMAL LOW (ref 0.61–1.24)
GFR calc Af Amer: >60 mL/min (ref >60)
GFR calc non Af Amer: >60 mL/min (ref >60)
Glucose, Bld: 100 mg/dL — ABNORMAL HIGH (ref 70–99)
Potassium: 4 mmol/L (ref 3.5–5.1)
Sodium: 138 mmol/L (ref 135–145)
Total Bilirubin: 0.3 mg/dL (ref 0.3–1.2)
Total Protein: 7.3 g/dL (ref 6.5–8.1)

## 2019-03-17 LAB — URINE DRUG SCREEN, QUALITATIVE (ARMC ONLY)
Amphetamines, Ur Screen: POSITIVE — AB
Barbiturates, Ur Screen: NOT DETECTED
Benzodiazepine, Ur Scrn: NOT DETECTED
Cannabinoid 50 Ng, Ur ~~LOC~~: NOT DETECTED
Cocaine Metabolite,Ur ~~LOC~~: NOT DETECTED
MDMA (Ecstasy)Ur Screen: NOT DETECTED
Methadone Scn, Ur: NOT DETECTED
Opiate, Ur Screen: NOT DETECTED
Phencyclidine (PCP) Ur S: NOT DETECTED
Tricyclic, Ur Screen: NOT DETECTED

## 2019-03-17 LAB — SALICYLATE LEVEL: Salicylate Lvl: <7 mg/dL (ref 2.8–30.0)

## 2019-03-17 LAB — ACETAMINOPHEN LEVEL: Acetaminophen (Tylenol), Serum: <10 ug/mL — ABNORMAL LOW (ref 10–30)
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
## 2019-03-17 NOTE — ED Notes (Signed)
Pt belongings: 1 green jacket, 1 gray sweatshirt, 2 short sleeve t shirts, 1 long sleeve shirt, 1 pair work boots, 1 wristwatch and 1 braided wristband, 1 pair thermal pants, 1 pair jeans, 1 pair white socks, 1 pair of black gloves, 1 pair blue boxer briefs. No wallet, no cash, cell phone or other valuables. Belongings bagged (2 bags) and labeled per policy.
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
## 2019-03-18 NOTE — ED Notes (Signed)
Pt. Transferred to [facility] from ED to room 4 after screening for contraband. Report to include Situation, Background, Assessment and Recommendations from [facility]. Pt. Oriented to unit including Q15 minute rounds as well as the security cameras for their protection. Patient is alert and oriented, warm and dry in no acute distress. Patient denies SI, HI, and AVH. Pt. Encouraged to let me know if needs arise.

## 2019-03-18 NOTE — ED Notes (Signed)
Pt discharged home with a bus pass. VS stable. All belongings returned to patient. Pt signed for discharge. Discharge instructions reviewed with patient, but patient did not want to take them- left in dayroom.  Pt denies SI/HI.

## 2019-03-18 NOTE — ED Notes (Signed)
Pt will be discharged after lunch.

## 2019-03-18 NOTE — ED Notes (Signed)
Hourly rounding reveals patient sleeping in room. No complaints, stable, in no acute distress. Q15 minute rounds and monitoring via Security Cameras to continue. 

## 2019-03-18 NOTE — ED Provider Notes (Signed)
-----------------------------------------   6:01 AM on 03/18/2019 -----------------------------------------   Blood pressure (!) 126/108, pulse 77, temperature 97.7 F (36.5 C), temperature source Oral, resp. rate 16, height 5\' 7"  (1.702 m), weight 74.8 kg, SpO2 98 %.  The patient is sleeping at this time.  There have been no acute events since the last update.  Awaiting disposition plan from Behavioral Medicine and/or Social Work team(s).   Paulette Blanch, MD 03/18/19 [phone]

## 2019-03-18 NOTE — ED Notes (Signed)
Pt ambulated to restroom, was given drink and returned to bed.

## 2019-03-18 NOTE — ED Provider Notes (Signed)
-----------------------------------------   10:33 AM on 03/18/2019 -----------------------------------------  Patient has been seen and evaluated by psychiatry.  They believe the patient safe for discharge home from a psychiatric standpoint.  Medical work-up is been largely nonrevealing.   Harvest Dark, MD 03/18/19 [phone]

## 2019-03-18 NOTE — Discharge Instructions (Addendum)
You have been seen in the emergency department for a  psychiatric concern. You have been evaluated both medically as well as psychiatrically. Please follow-up with your outpatient resources provided. Return to the emergency department for any worsening symptoms, or any thoughts of hurting yourself or anyone else so that we may attempt to help you. 

## 2019-03-18 NOTE — ED Notes (Signed)
Pt ambulated to bathroom without difficulty.  Returned to bed.

## 2019-03-18 NOTE — ED Notes (Signed)
Pt to be discharged by psychiatry.

## 2019-03-18 NOTE — ED Notes (Signed)
covid swab walked to lab. Pt moved to bhu.

## 2019-03-18 NOTE — ED Notes (Signed)
Meal tray provided.

## 2019-03-19 LAB — NOVEL CORONAVIRUS, NAA (HOSP ORDER, SEND-OUT TO REF LAB; TAT 18-24 HRS): SARS-CoV-2, NAA: NOT DETECTED

## 2019-08-26 ENCOUNTER — Other Ambulatory Visit: Payer: Self-pay

## 2019-08-26 ENCOUNTER — Ambulatory Visit: Payer: Self-pay | Admitting: Gerontology

## 2019-08-26 VITALS — BP 142/85 | HR 88 | Ht 67.5 in | Wt 160.7 lb

## 2019-08-26 DIAGNOSIS — L02412 Cutaneous abscess of left axilla: Secondary | ICD-10-CM | POA: Insufficient documentation

## 2019-08-26 DIAGNOSIS — Z7689 Persons encountering health services in other specified circumstances: Secondary | ICD-10-CM | POA: Insufficient documentation

## 2019-08-26 DIAGNOSIS — F1910 Other psychoactive substance abuse, uncomplicated: Secondary | ICD-10-CM

## 2019-08-26 NOTE — Progress Notes (Signed)
Patient ID: Nicholas Lindsey, male   DOB: 1965-06-26, 54 y.o.   MRN: [id number]  Chief Complaint  Patient presents with  . Abscess    Left arm, occurred 3 days ago, from drug use  . Neck Pain  . Joint Swelling    left leg and ankle    HPI Nicholas Lindsey is a 53 y.o. male who presents to establish care and evaluation of his chronic conditions. Currently,he states that he has an abscess approximately  1/2 the size of a golf ball to his left axillae that started 3 days ago after injecting Methamphetamine. He denies fever, chills, but endorses erythema and pain when site is touched. He reports that he has been using Methamphetamines daily for many years and admits the desire to quit. His blood pressure was mildly elevated during visit, he denies chest pain, palpitation and light headedness. Overall, he states that he's doing well and offers no further complaint.   Past Medical History:  Diagnosis Date  . History of kidney stones   . Left breast abscess 2008   I&D required- [place], [place] (per patient)  . Nodule of anterior chest wall 01/19/2017    Past Surgical History:  Procedure Laterality Date  . ABCESS DRAINAGE Left 2008   Breast- [place], [place]    Family History  Problem Relation Age of Onset  . Diabetes Mother   . Hypertension Mother   . Heart attack Father     Social History Social History   Tobacco Use  . Smoking status: Never Smoker  . Smokeless tobacco: Current User    Types: Chew  . Tobacco comment: 1/2 Can chew Daily  Substance Use Topics  . Alcohol use: Not Currently    Comment: 12 Beers / Weekly  . Drug use: Yes    Types: Methamphetamines    No Known Allergies  No current outpatient medications on file.   No current facility-administered medications for this visit.    Review of Systems Review of Systems  Constitutional: Negative.   HENT: Negative.   Eyes: Negative.   Respiratory: Negative.   Cardiovascular: Negative.   Gastrointestinal: Negative.    Endocrine: Negative.   Genitourinary: Negative.   Musculoskeletal: Negative.   Skin:       Abscess to left axillae  Neurological: Negative.   Hematological: Negative.   Psychiatric/Behavioral: Negative.        Uses Methamphetamines daily    Blood pressure (!) 142/85, pulse 88, height 5' 7.5" (1.715 m), weight 160 lb 11.2 oz (72.9 kg), SpO2 98 %.  Physical Exam Physical Exam HENT:     Head: Normocephalic and atraumatic.     Nose:     Comments: Deferred per Covid protocol    Mouth/Throat:     Comments: Deferred per Covid protocol Eyes:     Extraocular Movements: Extraocular movements intact.     Pupils: Pupils are equal, round, and reactive to light.  Cardiovascular:     Rate and Rhythm: Normal rate and regular rhythm.     Pulses: Normal pulses.     Heart sounds: Normal heart sounds.  Pulmonary:     Effort: Pulmonary effort is normal.     Breath sounds: Normal breath sounds.  Abdominal:     General: Abdomen is flat. Bowel sounds are normal.     Palpations: Abdomen is soft.  Genitourinary:    Comments: Deferred per patient. Musculoskeletal:        General: Normal range of motion.     Cervical  back: Normal range of motion.  Skin:    Findings: Erythema present.       Neurological:     General: No focal deficit present.     Mental Status: He is alert and oriented to person, place, and time. Mental status is at baseline.  Psychiatric:        Mood and Affect: Mood normal.        Behavior: Behavior normal.        Thought Content: Thought content normal.        Judgment: Judgment normal.     Data Reviewed Lab and past medical history was reviewed.  Assessment and Plan  1. Encounter to establish care - Routine labs will be checked. - CBC w/Diff; Future - Comp Met (CMET); Future - Lipid panel; Future - HgB A1c; Future - Urinalysis; Future - Hepatitis, Acute; Future  2. Abscess of axilla, left - He declines to go with EMS to ED from clinic, stating that he  will go to the Emergency Dept for left axillae abscess evaluation.  3. 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 Simpson for mental health evaluation and referral. - Hepatitis, Acute will be checked due to IV drug abuse.   Follow up: 09/17/2019 or if symptom worsens or fails to improve.  [name] [name] 08/26/2019, 11:58 AM

## 2019-08-26 NOTE — Patient Instructions (Signed)
Illegal Drug Use Information, Adult Illegal drugs are chemicals and substances that are illegal to use, sell, or have (possess). Health care providers and pharmacies do not use or carry these types of drugs to treat medical problems because they can cause serious side effects and can lead to death. Examples of illegal drugs include:  Cocaine or crack.  Meth (methamphetamine) or crystal meth.  "Bath salts" (synthetic cathinones).  Heroin.  LSD, PCP, or acid.  Ecstasy. What is drug dependence? Using illegal drugs often leads to dependence or addiction. When you use certain drugs over a long period of time, your brain chemistry changes so that you can no longer function normally without that drug. This is called drug dependence. Drug dependence can cause you to:  Have unpleasant feelings and physical problems when you stop using the drug (withdrawal).  Be unable to perform at work or at home, or do the activities you used to do, without using the drug. Some drugs make people feel so good that they want to use the drug again and again. This is called drug addiction. People who are addicted spend a lot of time seeking out the drug so that they can get the feeling they want from it. Addiction and dependence can be very hard to overcome. How can illegal drug use and dependence affect me? Using an illegal drug only once can have a major impact on your life. It is possible to die from side effects after using a drug just once. If you use an illegal drug repeatedly, you may need to take larger and larger doses of the drug to experience the feelings you want. Drug dependency and addiction may lead to:  Being unable to care for yourself and others.  Withdrawal, if you stop using the drug.  Negative effects on your relationships and work performance. It causes others not to trust you. If you have children, you could lose custody of them.  Behaving in ways that do not match your values.  Lying and  crime, such as stealing.  Jail or prison. This can affect your ability to find a good job or continue your education.  Health problems such as tooth loss, skin problems, heart and lung disease, and stomach problems.  If you are a woman, you may have problems with pregnancy, including: ? Losing the pregnancy early (miscarriage), early delivery (premature birth), or delivering a lifeless infant (stillbirth). ? Slow or abnormal growth (birth defects) of your unborn child. ? Giving birth to a newborn who is addicted to illegal drugs.  A drug overdose. This is a dangerous situation that requires hospitalization and often leads to death. What are the benefits of avoiding illegal drug use? Avoiding illegal drug use can:  Keep your mind and body healthy. This can help improve work performance and participation in activities.  Keep you from developing drug dependency or addiction. This can help you avoid negative side effects such as withdrawal and overdose.  Help you have healthy relationships with your friends and family.  Help you have more stable finances. Instead of using your money for drugs, you can: ? Spend it on things you would like to have. ? Save it for future use, such as for retirement or for big purchases like a car or a house.  Help you avoid a permanent criminal record, jail time, or prison time. Having a clean record allows you to have more job and educational opportunities in the future. What actions can be taken? To avoid using  illegal drugs:  Find healthy ways to cope with stress, such as exercise, meditation, or spending time with family and friends. Talk with your health care provider about how you feel and how to cope with stress.  Spend time with people who do not use illegal drugs, or make new friends who do not use drugs.  Do something else instead of using drugs. You can exercise, take up a hobby, or participate in activities that you can do with others.  Do not  be afraid to say no if someone offers you an illegal drug. Speak up about why you do not want to use drugs. You can be a positive role model for others.  Work with a health care provider or counselor to create a program for yourself to help you deal with various aspects of your addiction. Where to find more information You can find more information about illegal drug use, dependence, and addiction from:  Your health care provider or mental health counselor.  Narcotics Anonymous: www.na.org  Substance Abuse and Mental Health Services Administration: ? Treatment finder: [facility]ohomes.com ? National helpline: 1-800-662-HELP 228-736-0210) Contact a health care provider if:  You use illegal drugs.  You have missed family activities or work to use illegal drugs.  You have lied or stolen to get illegal drugs.  You lose interest in things you used to enjoy, like hobbies or family activities.  Your eating or sleeping habits change as a result of drug use.  You use medicine to get the same effects as a drug. This is illegal use and can become addictive as well.  You stopped illegal drug use previously and you start actively using it again (relapse).  You want help to change your addictive behavior. Get help right away if:  You have thoughts about hurting yourself or others. If you ever feel like you may hurt yourself or others, or have thoughts about taking your own life, get help right away. You can go to your nearest emergency department or call:  Your local emergency services (911 in the [place]  A suicide crisis helpline, such as the [facility] at 330-654-9884. This is open 24 hours a day. Summary  Illegal drugs are chemicals and substances that are illegal to use, sell, or possess.  Using illegal drugs often leads to dependence or addiction. This means that you need the drug to feel normal.  If you use illegal drugs, look for resources to help  you quit and find healthy ways to cope with stress. This information is not intended to replace advice given to you by your health care provider. Make sure you discuss any questions you have with your health care provider. Document Revised: 12/18/2018 Document Reviewed: 09/20/2016 Elsevier Patient Education  [employer].  Skin Abscess  A skin abscess is an infected area of your skin that contains pus and other material. An abscess can happen in any part of your body. Some abscesses break open (rupture) on their own. Most continue to get worse unless they are treated. The infection can spread deeper into the body and into your blood, which can make you feel sick. A skin abscess is caused by germs that enter the skin through a cut or scrape. It can also be caused by blocked oil and sweat glands or infected hair follicles. This condition is usually treated by:  Draining the pus.  Taking antibiotic medicines.  Placing a warm, wet washcloth over the abscess. Follow these instructions at home: Medicines  Take over-the-counter and prescription medicines only as told by your doctor.  If you were prescribed an antibiotic medicine, take it as told by your doctor. Do not stop taking the antibiotic even if you start to feel better. Abscess care   If you have an abscess that has not drained, place a warm, clean, wet washcloth over the abscess several times a day. Do this as told by your doctor.  Follow instructions from your doctor about how to take care of your abscess. Make sure you: ? Cover the abscess with a bandage (dressing). ? Change your bandage or gauze as told by your doctor. ? Wash your hands with soap and water before you change the bandage or gauze. If you cannot use soap and water, use hand sanitizer.  Check your abscess every day for signs that the infection is getting worse. Check for: ? More redness, swelling, or pain. ? More fluid or blood. ? Warmth. ? More pus or a  bad smell. General instructions  To avoid spreading the infection: ? Do not share personal care items, towels, or hot tubs with others. ? Avoid making skin-to-skin contact with other people.  Keep all follow-up visits as told by your doctor. This is important. Contact a doctor if:  You have more redness, swelling, or pain around your abscess.  You have more fluid or blood coming from your abscess.  Your abscess feels warm when you touch it.  You have more pus or a bad smell coming from your abscess.  You have a fever.  Your muscles ache.  You have chills.  You feel sick. Get help right away if:  You have very bad (severe) pain.  You see red streaks on your skin spreading away from the abscess. Summary  A skin abscess is an infected area of your skin that contains pus and other material.  The abscess is caused by germs that enter the skin through a cut or scrape. It can also be caused by blocked oil and sweat glands or infected hair follicles.  Follow your doctor's instructions on caring for your abscess, taking medicines, preventing infections, and keeping follow-up visits. This information is not intended to replace advice given to you by your health care provider. Make sure you discuss any questions you have with your health care provider. Document Revised: 10/31/2018 Document Reviewed: 05/10/2017 Elsevier Patient Education  2020 [employer].

## 2019-09-02 ENCOUNTER — Institutional Professional Consult (permissible substitution): Payer: Self-pay | Admitting: Licensed Clinical Social Worker

## 2019-09-10 ENCOUNTER — Other Ambulatory Visit: Payer: Self-pay

## 2019-09-10 DIAGNOSIS — F1910 Other psychoactive substance abuse, uncomplicated: Secondary | ICD-10-CM

## 2019-09-10 DIAGNOSIS — Z7689 Persons encountering health services in other specified circumstances: Secondary | ICD-10-CM

## 2019-09-11 LAB — URINALYSIS
Bilirubin, UA: NEGATIVE
Glucose, UA: NEGATIVE
Ketones, UA: NEGATIVE
Nitrite, UA: NEGATIVE
Specific Gravity, UA: 1.016 (ref 1.005–1.030)
Urobilinogen, Ur: 0.2 mg/dL (ref 0.2–1.0)
pH, UA: 5 (ref 5.0–7.5)

## 2019-09-11 LAB — CBC WITH DIFFERENTIAL/PLATELET
Basophils Absolute: 0 10*3/uL (ref 0.0–0.2)
Basos: 1 %
EOS (ABSOLUTE): 0.1 10*3/uL (ref 0.0–0.4)
Eos: 2 %
Hematocrit: 44.2 % (ref 37.5–51.0)
Hemoglobin: 14.8 g/dL (ref 13.0–17.7)
Immature Grans (Abs): 0 10*3/uL (ref 0.0–0.1)
Immature Granulocytes: 0 %
Lymphocytes Absolute: 1.7 10*3/uL (ref 0.7–3.1)
Lymphs: 26 %
MCH: 29.5 pg (ref 26.6–33.0)
MCHC: 33.5 g/dL (ref 31.5–35.7)
MCV: 88 fL (ref 79–97)
Monocytes Absolute: 0.7 10*3/uL (ref 0.1–0.9)
Monocytes: 11 %
Neutrophils Absolute: 4 10*3/uL (ref 1.4–7.0)
Neutrophils: 60 %
Platelets: 284 10*3/uL (ref 150–450)
RBC: 5.01 x10E6/uL (ref 4.14–5.80)
RDW: 13.1 % (ref 11.6–15.4)
WBC: 6.6 10*3/uL (ref 3.4–10.8)

## 2019-09-11 LAB — HEPATITIS PANEL, ACUTE
Hep A IgM: NEGATIVE
Hep B C IgM: NEGATIVE
Hep C Virus Ab: >11 s/co ratio — ABNORMAL HIGH (ref 0.0–0.9)
Hepatitis B Surface Ag: NEGATIVE

## 2019-09-11 LAB — LIPID PANEL
Chol/HDL Ratio: 2.7 ratio (ref 0.0–5.0)
Cholesterol, Total: 132 mg/dL (ref 100–199)
HDL: 49 mg/dL (ref >39)
LDL Chol Calc (NIH): 72 mg/dL (ref 0–99)
Triglycerides: 46 mg/dL (ref 0–149)
VLDL Cholesterol Cal: 11 mg/dL (ref 5–40)

## 2019-09-11 LAB — COMPREHENSIVE METABOLIC PANEL
ALT: 53 IU/L — ABNORMAL HIGH (ref 0–44)
AST: 45 IU/L — ABNORMAL HIGH (ref 0–40)
Albumin/Globulin Ratio: 1.2 (ref 1.2–2.2)
Albumin: 3.9 g/dL (ref 3.8–4.9)
Alkaline Phosphatase: 89 IU/L (ref 48–121)
BUN/Creatinine Ratio: 15 (ref 9–20)
BUN: 13 mg/dL (ref 6–24)
Bilirubin Total: 0.3 mg/dL (ref 0.0–1.2)
CO2: 22 mmol/L (ref 20–29)
Calcium: 9.1 mg/dL (ref 8.7–10.2)
Chloride: 106 mmol/L (ref 96–106)
Creatinine, Ser: 0.84 mg/dL (ref 0.76–1.27)
GFR calc Af Amer: 114 mL/min/{1.73_m2} (ref >59)
GFR calc non Af Amer: 99 mL/min/{1.73_m2} (ref >59)
Globulin, Total: 3.3 g/dL (ref 1.5–4.5)
Glucose: 88 mg/dL (ref 65–99)
Potassium: 4.1 mmol/L (ref 3.5–5.2)
Sodium: 141 mmol/L (ref 134–144)
Total Protein: 7.2 g/dL (ref 6.0–8.5)

## 2019-09-11 LAB — HEMOGLOBIN A1C
Est. average glucose Bld gHb Est-mCnc: 114 mg/dL
Hgb A1c MFr Bld: 5.6 % (ref 4.8–5.6)

## 2019-09-17 ENCOUNTER — Ambulatory Visit: Payer: Self-pay | Admitting: Gerontology

## 2019-09-19 ENCOUNTER — Emergency Department: Payer: Self-pay

## 2019-09-19 ENCOUNTER — Encounter: Payer: Self-pay | Admitting: Emergency Medicine

## 2019-09-19 ENCOUNTER — Emergency Department
Admission: EM | Admit: 2019-09-19 | Discharge: 2019-09-19 | Disposition: A | Discharge status: Home / Self Care | Payer: Self-pay | Attending: Emergency Medicine | Admitting: Emergency Medicine

## 2019-09-19 ENCOUNTER — Other Ambulatory Visit: Payer: Self-pay

## 2019-09-19 DIAGNOSIS — W1830XA Fall on same level, unspecified, initial encounter: Secondary | ICD-10-CM | POA: Insufficient documentation

## 2019-09-19 DIAGNOSIS — F17290 Nicotine dependence, other tobacco product, uncomplicated: Secondary | ICD-10-CM | POA: Insufficient documentation

## 2019-09-19 DIAGNOSIS — F319 Bipolar disorder, unspecified: Secondary | ICD-10-CM | POA: Insufficient documentation

## 2019-09-19 DIAGNOSIS — W19XXXA Unspecified fall, initial encounter: Secondary | ICD-10-CM

## 2019-09-19 DIAGNOSIS — S0031XA Abrasion of nose, initial encounter: Secondary | ICD-10-CM | POA: Insufficient documentation

## 2019-09-19 DIAGNOSIS — Z20822 Contact with and (suspected) exposure to covid-19: Secondary | ICD-10-CM | POA: Insufficient documentation

## 2019-09-19 DIAGNOSIS — Y939 Activity, unspecified: Secondary | ICD-10-CM | POA: Insufficient documentation

## 2019-09-19 DIAGNOSIS — R519 Headache, unspecified: Secondary | ICD-10-CM | POA: Insufficient documentation

## 2019-09-19 DIAGNOSIS — Y999 Unspecified external cause status: Secondary | ICD-10-CM | POA: Insufficient documentation

## 2019-09-19 DIAGNOSIS — Y929 Unspecified place or not applicable: Secondary | ICD-10-CM | POA: Insufficient documentation

## 2019-09-19 DIAGNOSIS — L03119 Cellulitis of unspecified part of limb: Secondary | ICD-10-CM

## 2019-09-19 DIAGNOSIS — L03113 Cellulitis of right upper limb: Secondary | ICD-10-CM | POA: Insufficient documentation

## 2019-09-19 DIAGNOSIS — F1910 Other psychoactive substance abuse, uncomplicated: Secondary | ICD-10-CM | POA: Insufficient documentation

## 2019-09-19 DIAGNOSIS — F419 Anxiety disorder, unspecified: Secondary | ICD-10-CM | POA: Insufficient documentation

## 2019-09-19 LAB — URINE DRUG SCREEN, QUALITATIVE (ARMC ONLY)
Amphetamines, Ur Screen: POSITIVE — AB
Barbiturates, Ur Screen: NOT DETECTED
Benzodiazepine, Ur Scrn: NOT DETECTED
Cannabinoid 50 Ng, Ur ~~LOC~~: NOT DETECTED
Cocaine Metabolite,Ur ~~LOC~~: NOT DETECTED
MDMA (Ecstasy)Ur Screen: NOT DETECTED
Methadone Scn, Ur: NOT DETECTED
Opiate, Ur Screen: NOT DETECTED
Phencyclidine (PCP) Ur S: NOT DETECTED
Tricyclic, Ur Screen: NOT DETECTED

## 2019-09-19 LAB — COMPREHENSIVE METABOLIC PANEL
ALT: 41 U/L (ref 0–44)
AST: 36 U/L (ref 15–41)
Albumin: 3.5 g/dL (ref 3.5–5.0)
Alkaline Phosphatase: 61 U/L (ref 38–126)
Anion gap: 8 (ref 5–15)
BUN: 13 mg/dL (ref 6–20)
CO2: 27 mmol/L (ref 22–32)
Calcium: 8.8 mg/dL — ABNORMAL LOW (ref 8.9–10.3)
Chloride: 102 mmol/L (ref 98–111)
Creatinine, Ser: 0.85 mg/dL (ref 0.61–1.24)
GFR calc Af Amer: >60 mL/min (ref >60)
GFR calc non Af Amer: >60 mL/min (ref >60)
Glucose, Bld: 95 mg/dL (ref 70–99)
Potassium: 4 mmol/L (ref 3.5–5.1)
Sodium: 137 mmol/L (ref 135–145)
Total Bilirubin: 0.7 mg/dL (ref 0.3–1.2)
Total Protein: 7.6 g/dL (ref 6.5–8.1)

## 2019-09-19 LAB — CBC WITH DIFFERENTIAL/PLATELET
Abs Immature Granulocytes: 0.02 10*3/uL (ref 0.00–0.07)
Basophils Absolute: 0.1 10*3/uL (ref 0.0–0.1)
Basophils Relative: 1 %
Eosinophils Absolute: 0.2 10*3/uL (ref 0.0–0.5)
Eosinophils Relative: 3 %
HCT: 40.7 % (ref 39.0–52.0)
Hemoglobin: 14.1 g/dL (ref 13.0–17.0)
Immature Granulocytes: 0 %
Lymphocytes Relative: 25 %
Lymphs Abs: 1.6 10*3/uL (ref 0.7–4.0)
MCH: 30.4 pg (ref 26.0–34.0)
MCHC: 34.6 g/dL (ref 30.0–36.0)
MCV: 87.7 fL (ref 80.0–100.0)
Monocytes Absolute: 0.5 10*3/uL (ref 0.1–1.0)
Monocytes Relative: 7 %
Neutro Abs: 3.9 10*3/uL (ref 1.7–7.7)
Neutrophils Relative %: 64 %
Platelets: 263 10*3/uL (ref 150–400)
RBC: 4.64 MIL/uL (ref 4.22–5.81)
RDW: 12.5 % (ref 11.5–15.5)
WBC: 6.2 10*3/uL (ref 4.0–10.5)
nRBC: 0 % (ref 0.0–0.2)

## 2019-09-19 LAB — SARS CORONAVIRUS 2 BY RT PCR (HOSPITAL ORDER, PERFORMED IN ~~LOC~~ HOSPITAL LAB): SARS Coronavirus 2: NEGATIVE

## 2019-09-19 LAB — ETHANOL: Alcohol, Ethyl (B): <10 mg/dL (ref <10)

## 2019-09-19 LAB — ACETAMINOPHEN LEVEL: Acetaminophen (Tylenol), Serum: <10 ug/mL — ABNORMAL LOW (ref 10–30)

## 2019-09-19 LAB — SALICYLATE LEVEL: Salicylate Lvl: <7 mg/dL — ABNORMAL LOW (ref 7.0–30.0)

## 2019-09-19 MED ORDER — DOXYCYCLINE HYCLATE 100 MG PO TABS
100.0000 mg | ORAL_TABLET | Freq: Two times a day (BID) | ORAL | 0 refills | Status: DC
Start: 2019-09-19 — End: 2019-10-09

## 2019-09-19 MED ORDER — ARIPIPRAZOLE 5 MG PO TABS: 5.0000 mg | ORAL_TABLET | Freq: Every day | ORAL | Status: DC

## 2019-09-19 MED ORDER — DULOXETINE HCL 30 MG PO CPEP
30.0000 mg | ORAL_CAPSULE | Freq: Every day | ORAL | Status: DC
  Filled 2019-09-19: qty 1

## 2019-09-19 MED ORDER — ARIPIPRAZOLE 5 MG PO TABS
5.0000 mg | ORAL_TABLET | Freq: Every day | ORAL | 0 refills | Status: DC
Start: 2019-09-19 — End: 2023-10-26

## 2019-09-19 MED ORDER — DOXYCYCLINE HYCLATE 100 MG PO TABS
100.0000 mg | ORAL_TABLET | Freq: Two times a day (BID) | ORAL | Status: DC
  Administered 2019-09-19: 100 mg via ORAL
  Filled 2019-09-19: qty 1

## 2019-09-19 MED ORDER — DULOXETINE HCL 30 MG PO CPEP
30.0000 mg | ORAL_CAPSULE | Freq: Every day | ORAL | 0 refills | Status: DC
Start: 2019-09-19 — End: 2019-12-12

## 2019-09-19 NOTE — ED Notes (Signed)

## 2019-09-19 NOTE — Final Progress Note (Signed)
Physician Final Progress Note  Patient ID: Nicholas Lindsey MRN: [id number] DOB/AGE: 1965/05/11 54 y.o.  Admit date: 09/19/2019 Admitting provider: Patient being discharged IVC rescinded  Discharge date: 09/19/2019   Admission Diagnoses:   Methamphetamine Intoxication  Methamphetamine dependence Bipolar disorder mixed Generalized anxiety  Sibling discord   Discharge Diagnoses:   Same --s/p fall and nose injury       Consults: [facility]TS Psych ED --  Significant Findings/ Diagnostic Studies:   Brief Mental Status   Alert somewhat cooperative  Oriented to person place date and time  Consciousness not clouded or fluctuant Speech ---mumbles / articulation issues Thought process and content ---no new delusions, violent themes or other psychosis  Consciousness not clouded or fluctuant Attention concentration and attention --somewhat diminished  Rapport poor ---poor coping and social skills  Mood irritable edgy frustrated Affect odd, somewhat flat   Contracts for safety without active SI HI or plans     Procedures:  Nasal care   Discharge Condition: fair  Disposition:   Diet: regular   Discharge Activity: discharge home with referrals to substance dependence and to community mental health  Cymbalta 30 and Abilify 5 started in ER with one month script to continue into outpatient care     Follow-up Information    Schedule an appointment as soon as possible for a visit  with [facility]   Contact information: [place] [address] [phone]               Total time spent taking care of this patient: 40-50 minutes  Signed: Eulas Post 09/19/2019, 2:31 PM
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
## 2019-09-19 NOTE — ED Notes (Signed)
Pt. Alert and oriented, warm and dry, in no distress. Pt. Denies SI, HI, and AVH. Pt states he fell yesterday and hit his nose and was wanting brother to see it. Pt. Encouraged to let nursing staff know of any concerns or needs.

## 2019-09-19 NOTE — ED Provider Notes (Addendum)
-----------------------------------------   10:08 AM on 09/19/2019 -----------------------------------------  CT scans are negative for acute traumatic injury.  Ultrasound is consistent with cellulitis without drainable abscess.  Patient started on doxycycline to cover for cellulitis.   Harvest Dark, MD 09/19/19 1008   EKG viewed and interpreted by myself shows a normal sinus rhythm at 60 bpm with a narrow QRS, normal axis, normal intervals, no concerning ST changes.    Harvest Dark, MD 09/19/19 1105   Patient has been seen and evaluated by psychiatry.  They believe the patient is safe for discharge home.  They are requesting that I prescribe the patient a 30-day supply of Cymbalta and Abilify which I will do so.  Patient's medical evaluation largely nonrevealing.   Harvest Dark, MD 09/19/19 1427

## 2019-09-19 NOTE — BH Assessment (Addendum)
Assessment Note  Nicholas Lindsey is an 54 y.o. male who presents to [facility] ED involuntarily for treatment. Per triage note, Patient ambulatory to triage with steady gait, without difficulty or distress noted, in custody of [place] PD for IVC; pt reports he is here because his brother called the police because he fell and hit his nose; abrasion noted to bridge; pt denies SI or HI.  During TTS assessment pt presented calm, with agitated speech and mood, slightly irritable and oriented x 3. Pt confirmed the information provide to triage RN. Pt reports waking up yesterday morning, accidentally falling which resulted to an argument with his brother. Pt was unclear about what the argument was about and denied being under the influence of any substance at that time. Pt reports a hx of arguing with his brother stating "My brother has his own problems (schizophrenia) and we argue a lot".  Pt reports a hx of Meth use and his last use to be a week ago. Pt reports active Meth use to increase his energy. Pt denies any depression stating "Not really, only when I do not use meth but maybe ADHD".  PT denies any other SA. Pt reports a MH hx of bipolar but denies to be currently taking medications. During the provider's attempt to explore medication pt was apprehensive and uninterested stating "I don't want to take any medications", unclear why. Pt denies struggles with sleeping/eating. PT reports a previous INPT hx with 6 years ago with [place] and denies any previous or current OPT. Pt reports to be uninterested in INPT for SA but agreed to seek OPT for SA through [place]. Pt refused TTS assistance with making contact and stated "I will call on my own".  Pt denied plans to return to his brother home right now or to have a cell phone. Pt reports plans to house sit for a friend while he/she is out of town for the week and plan to utilize his brother's cell phone to follow up with services. Pt  denies any current SI/HI/AH/VH and contacted for safety.  Per Dr. Janese Banks pt will be discharged with the recommendation to follow up with services provided  Diagnosis: Methamphetamine Intoxication   Past Medical History:  Past Medical History:  Diagnosis Date  . History of kidney stones   . Left breast abscess 2008   I&D required- [place], [place] (per patient)  . Nodule of anterior chest wall 01/19/2017    Past Surgical History:  Procedure Laterality Date  . ABCESS DRAINAGE Left 2008   Breast- [place],     Family History:  Family History  Problem Relation Age of Onset  . Diabetes Mother   . Hypertension Mother   . Heart attack Father     Social History:  reports that he has never smoked. His smokeless tobacco use includes chew. He reports previous alcohol use. He reports current drug use. Drug: Methamphetamines.  Additional Social History:  Alcohol / Drug Use Pain Medications: see mar Prescriptions: see mar Over the Counter: see mar History of alcohol / drug use?: Yes Substance #1 Name of Substance 1: Meth (Last use, a week ago)  CIWA: CIWA-Ar BP: (!) 141/86 Pulse Rate: 77 COWS:    Allergies: No Known Allergies  Home Medications: (Not in a hospital admission)   OB/GYN Status:  No LMP for male patient.  General Assessment Data Location of Assessment: [facility] ED TTS Assessment: In system Is this a Tele or Face-to-Face Assessment?: Face-to-Face Is this  an Initial Assessment or a Re-assessment for this encounter?: Initial Assessment Patient Accompanied by:: N/A Language Other than English: No Living Arrangements: Other (Comment) What gender do you identify as?: Male Marital status: Single Maiden name: N/A Pregnancy Status: No Living Arrangements: Other relatives Can pt return to current living arrangement?:  (unknown at this time, brother Legrand Como did not answer TC)) Admission Status: Involuntary Is patient capable of signing voluntary admission?:  Yes Referral Source: Self/Family/Friend Insurance type: None   Medical Screening Exam [facility] Walk-in ONLY) Medical Exam completed: Yes  Crisis Care Plan Living Arrangements: Other relatives Legal Guardian:  (self) Name of Psychiatrist: None reported  Name of Therapist: None reported   Education Status Is patient currently in school?: No Is the patient employed, unemployed or receiving disability?: Unemployed  Risk to self with the past 6 months Suicidal Ideation: No Has patient been a risk to self within the past 6 months prior to admission? : No Suicidal Intent: No Has patient had any suicidal intent within the past 6 months prior to admission? : No Is patient at risk for suicide?: No, but patient needs Medical Clearance Suicidal Plan?: No Has patient had any suicidal plan within the past 6 months prior to admission? : No Access to Means: No What has been your use of drugs/alcohol within the last 12 months?: Meth  Previous Attempts/Gestures: No How many times?: 0 Other Self Harm Risks: None reported  Triggers for Past Attempts: None known Intentional Self Injurious Behavior: None Family Suicide History: No Recent stressful life event(s):  (None reported ) Persecutory voices/beliefs?: No Depression: Yes (Pt reports "not really") Depression Symptoms: Feeling angry/irritable Substance abuse history and/or treatment for substance abuse?: Yes Suicide prevention information given to non-admitted patients: Not applicable  Risk to Others within the past 6 months Homicidal Ideation: No Does patient have any lifetime risk of violence toward others beyond the six months prior to admission? : No Thoughts of Harm to Others: No Current Homicidal Intent: No Current Homicidal Plan: No Access to Homicidal Means: No Identified Victim: n/a History of harm to others?:  (unknown ) Assessment of Violence: None Noted Violent Behavior Description:  (IVC reports pt being aggressive towards  siblings ) Does patient have access to weapons?: No Criminal Charges Pending?: No Does patient have a court date: No Is patient on probation?: No  Psychosis Hallucinations: None noted Delusions: None noted  Mental Status Report Appearance/Hygiene: In scrubs Eye Contact: Fair Motor Activity: Agitation, Unsteady Speech: Pressured, Slurred Level of Consciousness: Alert, Irritable Mood: Anxious, Irritable, Apprehensive Affect: Anxious, Apprehensive, Irritable Anxiety Level: Minimal Thought Processes: Coherent, Relevant Judgement: Partial Orientation: Person, Place, Time, Situation Obsessive Compulsive Thoughts/Behaviors: Minimal (needing to house sit for a friend )  Cognitive Functioning Concentration: Fair Memory: Recent Intact, Remote Intact Is patient IDD: No Insight: Poor Impulse Control: Fair Appetite: Good Have you had any weight changes? : No Change Sleep: No Change Total Hours of Sleep: 8 Vegetative Symptoms: None  ADLScreening [facility] Assessment Services) Independently performs ADLs?: Yes (appropriate for developmental age)  Prior Inpatient Therapy Prior Inpatient Therapy: Yes Prior Therapy Dates: 6 years ago Prior Therapy Facilty/Provider(s): [place]  Reason for Treatment: SA  Prior Outpatient Therapy Prior Outpatient Therapy: No Does patient have an ACCT team?: No Does patient have Intensive In-House Services?  : No Does patient have [facility] services? : No Does patient have P4CC services?: No  ADL Screening (condition at time of admission) Is the patient deaf or have difficulty hearing?: No  Does the patient have difficulty seeing, even when wearing glasses/contacts?: No Does the patient have difficulty concentrating, remembering, or making decisions?: No Does the patient have difficulty dressing or bathing?: No Independently performs ADLs?: Yes (appropriate for developmental age) Does the patient have difficulty walking or  climbing stairs?: No Weakness of Legs: None Weakness of Arms/Hands: None  Home Assistive Devices/Equipment Home Assistive Devices/Equipment: None  Therapy Consults (therapy consults require a physician order) PT Evaluation Needed: No OT Evalulation Needed: No SLP Evaluation Needed: No Abuse/Neglect Assessment (Assessment to be complete while patient is alone) Abuse/Neglect Assessment Can Be Completed: Yes Physical Abuse: Denies Verbal Abuse: Denies Sexual Abuse: Denies Exploitation of patient/patient's resources: Denies Self-Neglect: Denies Values / Beliefs Cultural Requests During Hospitalization: None Spiritual Requests During Hospitalization: None Consults Spiritual Care Consult Needed: No Transition of Care Team Consult Needed: No Advance Directives (For Healthcare) Does Patient Have a Medical Advance Directive?: No Would patient like information on creating a medical advance directive?: No - Patient declined          Disposition:  Disposition Initial Assessment Completed for this Encounter: Yes Patient referred to: Outpatient clinic referral ([facility])  On Site Evaluation by:   Reviewed with Physician:    Shanon Ace 09/19/2019 2:40 PM

## 2019-09-19 NOTE — ED Notes (Signed)
With male officer in attendance, pt removes white t-shirt, jeans, tan tennis shoes, red socks--all placed in labeled pt belonging bag to be secured on nursing unit & pt changed into paper scrubs; redness noted to antecubital regions bilat; pt st "it's a spider bite I guess"

## 2019-09-19 NOTE — ED Provider Notes (Signed)
[facility] Emergency Department Provider Note  ____________________________________________   First MD Initiated Contact with Patient 09/19/19 [phone]     (approximate)  I have reviewed the triage vital signs and the nursing notes.   HISTORY  Chief Complaint Mental Health Problem    HPI Nicholas Lindsey is a 54 y.o. male who comes in under IVC.  Patient states that his brother called the police because he fell and hit his nose.  Abrasion noted to his bridge of his nose.  He denies any SI or HI.  It appears that the concern was that he was a danger to himself due to his mental illness and concern for drug abuse.   Patient does report having a fall and hitting his head.  Does not sound like he had LOC.  States that he just lost his balance.  He also reports injecting himself with the meth.  He is got 2 large areas of warmth and redness on his bilateral forearms.  He is unsure exactly when the started but stated he just started using IV injection a few weeks ago.  He denies any alcohol use.          Past Medical History:  Diagnosis Date  . History of kidney stones   . Left breast abscess 2008   I&D required- [place], [place] (per patient)  . Nodule of anterior chest wall 01/19/2017    Patient Active Problem List   Diagnosis Date Noted  . Encounter to establish care 08/26/2019  . Abscess of axilla, left 08/26/2019  . 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 Aggression 03/17/2019  . Bipolar 1 disorder ([place]) 03/17/2019  . Facial cellulitis 04/03/2018  . Nodule of anterior chest wall 01/19/2017    Past Surgical History:  Procedure Laterality Date  . ABCESS DRAINAGE Left 2008   Breast- [place], [place]    Prior to Admission medications   Not on File    Allergies Patient has no known allergies.  Family History  Problem Relation Age of Onset  . Diabetes Mother   . Hypertension Mother   . Heart attack Father     Social History Social History    Tobacco Use  . Smoking status: Never Smoker  . Smokeless tobacco: Current User    Types: Chew  . Tobacco comment: 1/2 Can chew Daily  Vaping Use  . Vaping Use: Never used  Substance Use Topics  . Alcohol use: Not Currently    Comment: 12 Beers / Weekly  . Drug use: Yes    Types: Methamphetamines      Review of Systems Constitutional: No fever/chills, fall Eyes: No visual changes. ENT: No sore throat.  Nose pain Cardiovascular: Denies chest pain. Respiratory: Denies shortness of breath. Gastrointestinal: No abdominal pain.  No nausea, no vomiting.  No diarrhea.  No constipation. Genitourinary: Negative for dysuria. Musculoskeletal: Negative for back pain.  Pain in his forearms Skin: Negative for rash. Neurological: Negative for headaches, focal weakness or numbness. All other ROS negative ____________________________________________   PHYSICAL EXAM:  VITAL SIGNS: ED Triage Vitals  Enc Vitals Group     BP 09/19/19 0522 (!) 141/86     Pulse Rate 09/19/19 0522 77     Resp 09/19/19 0522 17     Temp --      Temp src --      SpO2 09/19/19 0522 100 %     Weight 09/19/19 0524 156 lb (70.8 kg)     Height 09/19/19 0524  5\' 7"  (1.702 m)     Head Circumference --      Peak Flow --      Pain Score 09/19/19 0523 7     Pain Loc --      Pain Edu? --      Excl. in [place]? --     Constitutional: Alert and oriented. Well appearing and in no acute distress. Eyes: Conjunctivae are normal. EOMI. Head: Atraumatic. Nose: No congestion/rhinnorhea. Mouth/Throat: Mucous membranes are moist.   Neck: No stridor. Trachea Midline. FROM.  Abrasion and tenderness over his nose.  No laceration Cardiovascular: Normal rate, regular rhythm. Grossly normal heart sounds.  Good peripheral circulation. Respiratory: Normal respiratory effort.  No retractions. Lungs CTAB. Gastrointestinal: Soft and nontender. No distention. No abdominal bruits.  Musculoskeletal: No lower extremity tenderness nor  edema.  No joint effusions.  Golf ball sized area of erythema and swelling noted to the bilateral forearms.  Good distal pulses. Neurologic:  Normal speech and language. No gross focal neurologic deficits are appreciated.  Skin:  Skin is warm, dry and intact. No rash noted. Psychiatric: Mood and affect are normal. Speech and behavior are normal. GU: Deferred   ____________________________________________   LABS (all labs ordered are listed, but only abnormal results are displayed)  Labs Reviewed  COMPREHENSIVE METABOLIC PANEL - Abnormal; Notable for the following components:      Result Value   Calcium 8.8 (*)    All other components within normal limits  CBC WITH DIFFERENTIAL/PLATELET  URINE DRUG SCREEN, QUALITATIVE ([facility] ONLY)  ETHANOL  SALICYLATE LEVEL  ACETAMINOPHEN LEVEL   ____________________________________________   ED ECG REPORT I, Vanessa Strawberry, the attending physician, personally viewed and interpreted this ECG.  Pending ____________________________________________  [place], personally viewed and evaluated these images (plain radiographs) as part of my medical decision making, as well as reviewing the written report by the radiologist.  ED MD interpretation:  Pending   Official radiology report(s): No results found.  ____________________________________________   PROCEDURES  Procedure(s) performed (including Critical Care):  Procedures   ____________________________________________   INITIAL IMPRESSION / ASSESSMENT AND PLAN / ED COURSE  Nicholas Lindsey was evaluated in Emergency Department on 09/19/2019 for the symptoms described in the history of present illness. He was evaluated in the context of the global COVID-19 pandemic, which necessitated consideration that the patient might be at risk for infection with the SARS-CoV-2 virus that causes COVID-19. Institutional protocols and algorithms that pertain to the evaluation of patients at  risk for COVID-19 are in a state of rapid change based on information released by regulatory bodies including the CDC and federal and state organizations. These policies and algorithms were followed during the patient's care in the ED.    Patient is a 54 year old who comes in with fall in the setting of meth abuse.  Will get CT head evaluate for intercranial hemorrhage, CT face to evaluate for facial fractures CT cervical evaluate for cervical fracture.  Patient also has warm golf ball sized swelling notable in the bilateral forearms where patient injects meth.  Will get ultrasound evaluate for abscess versus thrombosed vein.  Given the redness and erythema associated over the areas will start on some doxycycline for possible cellulitis.  From a psychiatric standpoint patient has been IVC in.  Will consult psychiatric services.  Patient handed off to oncoming team pending CT imaging and ultrasounds as well as EKG due to the fall.     The patient has been placed  in psychiatric observation due to the need to provide a safe environment for the patient while obtaining psychiatric consultation and evaluation, as well as ongoing medical and medication management to treat the patient's condition.  The patient has been placed under full IVC at this time.      ____________________________________________   FINAL CLINICAL IMPRESSION(S) / ED DIAGNOSES   Final diagnoses:  Fall, initial encounter  Substance abuse ([place])  Cellulitis of upper extremity, unspecified laterality      MEDICATIONS GIVEN DURING THIS VISIT:  Medications  doxycycline (VIBRA-TABS) tablet 100 mg (has no administration in time range)     ED Discharge Orders    None       Note:  This document was prepared using Dragon voice recognition software and may include unintentional dictation errors.   Vanessa [place], MD 09/19/19 [phone]

## 2019-09-19 NOTE — ED Notes (Signed)
Pt refused to give consent for his brother Legrand Como to get update. Pt also hang up the phone on the brother when he tried to talk to pt.

## 2019-09-19 NOTE — ED Triage Notes (Signed)
Patient ambulatory to triage with steady gait, without difficulty or distress noted, in custody of [place] [employer] for IVC; pt reports he is here because his brother called the police because he fell and hit his nose; abrasion noted to bridge; pt denies SI or HI

## 2019-09-19 NOTE — ED Notes (Signed)
Pt discharged home.

## 2019-09-20 ENCOUNTER — Emergency Department: Payer: No Typology Code available for payment source

## 2019-09-20 ENCOUNTER — Other Ambulatory Visit: Payer: Self-pay

## 2019-09-20 ENCOUNTER — Emergency Department
Admission: EM | Admit: 2019-09-20 | Discharge: 2019-09-20 | Disposition: A | Discharge status: Home / Self Care | Payer: No Typology Code available for payment source | Attending: Emergency Medicine | Admitting: Emergency Medicine

## 2019-09-20 DIAGNOSIS — Y92410 Unspecified street and highway as the place of occurrence of the external cause: Secondary | ICD-10-CM | POA: Insufficient documentation

## 2019-09-20 DIAGNOSIS — M79601 Pain in right arm: Secondary | ICD-10-CM | POA: Insufficient documentation

## 2019-09-20 DIAGNOSIS — Y9389 Activity, other specified: Secondary | ICD-10-CM | POA: Insufficient documentation

## 2019-09-20 DIAGNOSIS — Y998 Other external cause status: Secondary | ICD-10-CM | POA: Diagnosis not present

## 2019-09-20 DIAGNOSIS — Z5321 Procedure and treatment not carried out due to patient leaving prior to being seen by health care provider: Secondary | ICD-10-CM | POA: Diagnosis not present

## 2019-09-20 DIAGNOSIS — M549 Dorsalgia, unspecified: Secondary | ICD-10-CM | POA: Insufficient documentation

## 2019-09-20 DIAGNOSIS — M542 Cervicalgia: Secondary | ICD-10-CM | POA: Diagnosis not present

## 2019-09-20 DIAGNOSIS — M25511 Pain in right shoulder: Secondary | ICD-10-CM | POA: Insufficient documentation

## 2019-09-20 NOTE — ED Notes (Signed)
Pt visualized ambulatory out the lobby after being told he could not have anything to drink, "god damn I ain't never seen any shit like that, y'all make me madder than hell with this shit!" Pt ambulatory with steady gait. NAD noted at this time.
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
## 2019-09-20 NOTE — ED Notes (Signed)
This RN revitalized patient at this time, apologized and explained delay to patient. Pt repeatedly asking for Ibuprofen and water. This RN apologized and explained that patient would need to be evaluated by EDP prior to patient having any pain medication or having anything to drink.

## 2019-09-20 NOTE — ED Notes (Signed)
Pt yelling in lobby "this is bullshit". Pt states "I can't sit" then proceeds to lie on floor of lobby.

## 2019-09-20 NOTE — ED Notes (Signed)
Rigid c collar was applied by ems, pt encouraged to leave in place in triage, explanation of need to leave c collar on provided to pt. c collar removed per pt request in lobby. Pt states "i'll just take the consequences".

## 2019-09-23 ENCOUNTER — Telehealth: Payer: Self-pay | Admitting: Gerontology

## 2019-09-23 NOTE — Telephone Encounter (Signed)
Attempted to call pt regarding scheduling an appt. Mailbox was full

## 2019-09-24 ENCOUNTER — Telehealth: Payer: Self-pay | Admitting: Gerontology

## 2019-09-24 NOTE — Telephone Encounter (Signed)
LVM regarding scheduling a new appt.

## 2019-09-25 ENCOUNTER — Telehealth: Payer: Self-pay | Admitting: Gerontology

## 2019-09-25 NOTE — Telephone Encounter (Signed)
LVM regarding appt details.

## 2019-10-09 ENCOUNTER — Other Ambulatory Visit: Payer: Self-pay

## 2019-10-09 ENCOUNTER — Ambulatory Visit: Payer: Self-pay | Admitting: Gerontology

## 2019-10-09 VITALS — BP 139/84 | HR 101 | Ht 67.5 in | Wt 158.0 lb

## 2019-10-09 DIAGNOSIS — R000 Tachycardia, unspecified: Secondary | ICD-10-CM | POA: Insufficient documentation

## 2019-10-09 DIAGNOSIS — L02414 Cutaneous abscess of left upper limb: Secondary | ICD-10-CM | POA: Insufficient documentation

## 2019-10-09 DIAGNOSIS — R399 Unspecified symptoms and signs involving the genitourinary system: Secondary | ICD-10-CM | POA: Insufficient documentation

## 2019-10-09 DIAGNOSIS — F1910 Other psychoactive substance abuse, uncomplicated: Secondary | ICD-10-CM

## 2019-10-09 DIAGNOSIS — M7989 Other specified soft tissue disorders: Secondary | ICD-10-CM | POA: Insufficient documentation

## 2019-10-09 MED ORDER — NITROFURANTOIN MONOHYD MACRO 100 MG PO CAPS: 100.0000 mg | ORAL_CAPSULE | Freq: Two times a day (BID) | ORAL | 0 refills | Status: DC
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
## 2019-10-12 LAB — UA/M W/RFLX CULTURE, ROUTINE
Bilirubin, UA: NEGATIVE
Glucose, UA: NEGATIVE
Ketones, UA: NEGATIVE
Nitrite, UA: POSITIVE — AB
Protein,UA: NEGATIVE
RBC, UA: NEGATIVE
Specific Gravity, UA: 1.017 (ref 1.005–1.030)
Urobilinogen, Ur: 0.2 mg/dL (ref 0.2–1.0)
pH, UA: 5.5 (ref 5.0–7.5)

## 2019-10-12 LAB — MICROSCOPIC EXAMINATION
Casts: NONE SEEN /lpf
WBC, UA: >30 /hpf — AB (ref 0–5)

## 2019-10-12 LAB — URINE CULTURE, REFLEX

## 2019-10-14 ENCOUNTER — Other Ambulatory Visit: Payer: Self-pay

## 2019-10-14 ENCOUNTER — Ambulatory Visit: Payer: Self-pay | Admitting: Licensed Clinical Social Worker

## 2019-10-14 DIAGNOSIS — F1520 Other stimulant dependence, uncomplicated: Secondary | ICD-10-CM

## 2019-10-14 DIAGNOSIS — F1021 Alcohol dependence, in remission: Secondary | ICD-10-CM

## 2019-10-14 NOTE — BH Specialist Note (Signed)
Integrated Behavioral Health Comprehensive Clinical Assessment in office  MRN: [id number] Name: [name]  Type of Service: Integrated Behavioral Health-Individual Interpretor: No. Interpretor Name and Language: not applicable.   PRESENTING CONCERNS: ANICETO KYSER is a 54 y.o. male accompanied by himself.[name] was referred to [facility] clinician for substance abuse.   Previous mental health services Have you ever been treated for a mental health problem? Yes Gerrald was previously treated at [facility] and was diagnosed with Bipolar disorder.  If "Yes", when were you treated and whom did you see?  Have you ever been hospitalized for mental health treatment? Yes Jansen was previously hospitalized 17 years ago due to bizarre behavior and his dad had him involuntarily committed at [facility] in [place], [place].  Have you ever been treated for any of the following? Past Psychiatric History/Hospitalization(s): Anxiety: Yes Myer describes feeling anxious, nervous, and on edge nearly everyday, not being able to stop or control worrying, worrying too much about different things, difficulty relaxing, restlessness, becoming easily annoyed or irritable, and feeling afraid as if something awful might happen.  Bipolar Disorder: Yes Per Holbert he was previously diagnosed with Bipolar disorder by someone at [facility] in [place] but does not believe he has it because he was on meth at the time. He describes racing thoughts, increase in goal directed activity, easily distracted, decreased need for sleep, flight of ideas, psychomotor agitation, and pressured speech. He was unable to provided a timeline for these symptoms. Symptoms may be due to methamphetamine use and withdrawal. Per Shanon Brow, when he has gone a couple of days without sleep that he will experience auditory and visual hallucinations.  Depression: Yes Per Toribio he has been feeling down and depressed nearly every day,  loss of interest in previously enjoyed activities, insomnia, poor appetite, feeling bad about himself, restlessness, and difficulty concentrating. He denies suicidal and homicidal thoughts.  Mania: see above. Psychosis: see above. Schizophrenia: Negative Personality Disorder: Negative Hospitalization for psychiatric illness: Yes History of Electroconvulsive Shock Therapy: Negative Prior Suicide Attempts: Negative Have you ever had thoughts of harming yourself or others or attempted suicide? No plan to harm self or others  Medical history  has a past medical history of History of kidney stones, Left breast abscess (2008), and Nodule of anterior chest wall (01/19/2017). Primary Care Physician: Langston Reusing, NP Date of last physical exam:  Allergies: No Known Allergies Current medications:  Outpatient Encounter Medications as of 10/14/2019  Medication Sig  . ARIPiprazole (ABILIFY) 5 MG tablet Take 1 tablet (5 mg total) by mouth at bedtime. (Patient not taking: Reported on 10/09/2019)  . DULoxetine (CYMBALTA) 30 MG capsule Take 1 capsule (30 mg total) by mouth daily. (Patient not taking: Reported on 10/09/2019)  . nitrofurantoin, macrocrystal-monohydrate, (MACROBID) 100 MG capsule Take 1 capsule (100 mg total) by mouth 2 (two) times daily.   No facility-administered encounter medications on file as of 10/14/2019.   Have you ever had any serious medication reactions? No Is there any history of mental health problems or substance abuse in your family? Yes- Per the patient there is a history of mental illness and substance abuse in the family. One of his brothers carries a diagnosis of paranoid schizophrenia. He notes that his brother is not currently in treatment. One of his brothers recently passed away due to an overdose on Heroin.  Has anyone in your family been hospitalized for mental health treatment? No  Social/family history Who lives in your  current household? Roselyn Reef lives with one of  his brothers. He is unemployed.  What is your family of origin, childhood history?  Where were you born? Revan was born in [place].  Where did you grow up? Dreshon grew up in [facility] [place] [facility].  How many different homes have you lived in? Several. Describe your childhood: Fynn describes has his childhood was good.  Do you have siblings, step/half siblings? Yes- Matei is one of six siblings.  What are their names, relation, sex, age? Jewell is one of six brothers and one is deceased. The brother that passed was recent due to heroin overdose.  Are your parents separated or divorced? No What are your social supports? Wallie has the support of one of his brothers.   Education How many grades have you completed? Davy graduated high school and had one year of college but did not finish. He has had several falls.  Did you have any problems in school? No  Employment/financial issues Talis is unemployed and is thinking of applying for disability. 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   Sleep Usual bedtime varies.  Sleeping arrangements: unknown.  Problems with snoring:  Obstructive sleep apnea is not a concern. Problems with nightmares: No Problems with night terrors: No Problems with sleepwalking: No  Trauma/Abuse history Have you ever experienced or been exposed to any form of abuse? No Have you ever experienced or been exposed to something traumatic? No  Substance use Do you use alcohol, nicotine or caffeine? no alcohol use How old were you when you first tasted alcohol? See below.  Have you ever used illicit drugs or abused prescription medications? methamphetamines Markeese has been using meth starting at the age of 18. He explains that he has had seven years clean time when he went to a penitentiary. He notes that he relapsed on methamphetamine 8 months ago and starting injecting a quarter of a graham a day.  And last use was this morning. He has previously been in inpatient treatment at the [facility] in [facility] for two years until he graduated and went to a half way house. He notes that he had pending charges and ended up in prison for manufacturing meth for seven years. He notes that he has had three DWI's in the past. He explains that he used to drink alcohol heavily but stopped drinking 15 years ago and previously attended [employer] with a sponsor. He has not previously been in outpatient treatment for substance abuse. He asked about being referred to rehab for substance abuse.  Mental status General appearance/Behavior: Disheveled Eye contact: Absent Motor behavior: Restlestness Speech: Garbled and Slurred Level of consciousness: Confused and Drowsy Mood: Anxious Affect: Appropriate Anxiety level: Moderate Thought process: Disorganized Thought content: WNL Perception: Normal Judgment: Fair Insight: Present  Diagnosis No diagnosis found.  GOALS ADDRESSED: Patient will reduce symptoms of: methamphetamine abuse. and increase knowledge and/or ability of: healthy habits and self-management skills and also: Increase healthy adjustment to current life circumstances, Increase motivation to adhere to plan of care and Decrease self-medicating behaviors              INTERVENTIONS: Interventions utilized: Biopsychosocial assessment Standardized Assessments completed: GAD-7 and PHQ 9   ASSESSMENT/OUTCOME:  Youlanda Roys. Egolf is a 54 year old Caucasian male who presents today for a substance abuse assessment and was referred by Carlyon Shadow NP at [facility]. Bayard has been using methamphetamines beginning at the age of 32, unidentifiable amount (what he is able to afford), and last use was this morning. Per the patient, he switched to IV drug use in the last eight months.  He has previously been in inpatient treatment at the [facility] in [facility] for two years  until he graduated and went to a half way house. He notes that he had pending charges and ended up in prison for manufacturing meth for seven years. He notes that he has had three DWI's in the past. He explains that he used to drink alcohol heavily but stopped drinking 15 years ago and previously attended [employer] with a sponsor. He has not previously been in outpatient treatment for substance abuse. He asked about being referred to rehab for substance abuse.   Per the patient he has been previously diagnosed with Bipolar disorder at [facility] in [place], [place] but states that he does not believe the diagnosis is accurate because he was on Methamphetamine at the time. He was prescribed Aripiprazole 5 mg tablet and Cymbalta 30 mg capsule from the emergency room on June 12th at [facility] but lost the paper prescription and has no interest in taking either psychotropic medication. He reports that he was ivoluntarily committed by his dad 17 years ago when he was behaving erratically and was on drugs.   Kallen has a history of facial cellulitis, drug abuse, urinary tract infection, fast hear rate, and nodule of anterior chest wall. He has not history of adverse drug reactions. He has no allergies. He does not have any teeth as a result of methamphetamine use.  Raeshawn lives with his brother who has schizophrenia and is unemployed.  PLAN: [facility]erral to [facility]. Patient contact [facility] while therapist was in the room and completed a treatment screening over the phone. The intake staff at [facility] instructed the patient to contact once a week on Monday's for the next four weeks until an opening is available.  Scheduled next visit: No future visits scheduled. Patient was instructed to contact [facility] every Monday via phone to check on a status of a bed opening up for the next four weeks.   [place] [facility]

## 2019-10-20 ENCOUNTER — Telehealth: Payer: Self-pay | Admitting: Pharmacy Technician

## 2019-10-20 ENCOUNTER — Other Ambulatory Visit: Payer: Self-pay

## 2019-10-20 ENCOUNTER — Ambulatory Visit: Payer: Self-pay | Admitting: Pharmacy Technician

## 2019-10-20 DIAGNOSIS — Z79899 Other long term (current) drug therapy: Secondary | ICD-10-CM

## 2019-10-20 NOTE — Progress Notes (Signed)
Completed [facility] application and contract.  Patient agreed to all terms of the [facility] contract.    Patient approved to receive medication assistance at [facility] until time for re-certification in 6019, and as long as eligibility criteria continues to be met.    [facility]
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
## 2019-10-28 ENCOUNTER — Telehealth: Payer: Self-pay | Admitting: Licensed Clinical Social Worker

## 2019-10-28 NOTE — Telephone Encounter (Signed)
Clinician received a call from the patient's brother regarding if he needed to call her regarding a bed opening up.  Clinician contacted the patient's brother informing him that his brother needs to contact [facility] weekly regarding an open bed and that his brother must of misunderstood. She left a voicemail with call back information.

## 2019-11-12 ENCOUNTER — Ambulatory Visit: Payer: Self-pay | Admitting: Gerontology

## 2019-11-12 ENCOUNTER — Other Ambulatory Visit: Payer: Self-pay

## 2019-11-12 VITALS — BP 117/84 | HR 93 | Temp 96.5°F | Wt 150.9 lb

## 2019-11-12 DIAGNOSIS — R399 Unspecified symptoms and signs involving the genitourinary system: Secondary | ICD-10-CM

## 2019-11-12 DIAGNOSIS — F1910 Other psychoactive substance abuse, uncomplicated: Secondary | ICD-10-CM

## 2019-11-12 NOTE — Progress Notes (Signed)
Established Patient Office Visit  Subjective:  Patient ID: Nicholas Lindsey, male    DOB: April 25, 1965  Age: 54 y.o. MRN: [id number]  CC: No chief complaint on file.   HPI Nicholas Lindsey presents for follow up of urinary tract infection. He states that he finished his antibiotic course and his symptoms has resolved. He reports that he continues to inject Methamphetamines and his last use was 2 days ago. He admits the desire to go for rehabilitation. [facility] was called, and they did an intake interview and he was advised to call in every Monday to maintain his position on the wait list. Overall, he states that he's doing well and offers no further complaint.  Past Medical History:  Diagnosis Date  . History of kidney stones   . Left breast abscess 2008   I&D required- [place], [place] (per patient)  . Nodule of anterior chest wall 01/19/2017    Past Surgical History:  Procedure Laterality Date  . ABCESS DRAINAGE Left 2008   Breast- [place], [place]    Family History  Problem Relation Age of Onset  . Diabetes Mother   . Hypertension Mother   . Heart attack Father     Social History   Socioeconomic History  . Marital status: Single    Spouse name: Not on file  . Number of children: Not on file  . Years of education: Not on file  . Highest education level: Not on file  Occupational History  . Not on file  Tobacco Use  . Smoking status: Never Smoker  . Smokeless tobacco: Current User    Types: Chew  . Tobacco comment: 1/2 Can chew Daily  Vaping Use  . Vaping Use: Never used  Substance and Sexual Activity  . Alcohol use: Not Currently    Comment: 12 Beers / Weekly  . Drug use: Yes    Types: Methamphetamines  . Sexual activity: Not on file  Other Topics Concern  . Not on file  Social History Narrative  . Not on file   Social Determinants of Health   Financial Resource Strain:   . Difficulty of Paying Living Expenses:   Food Insecurity:   . Worried  About Charity fundraiser in the Last Year:   . Arboriculturist in the Last Year:   Transportation Needs:   . Film/video editor (Medical):   Marland Kitchen Lack of Transportation (Non-Medical):   Physical Activity:   . Days of Exercise per Week:   . Minutes of Exercise per Session:   Stress:   . Feeling of Stress :   Social Connections:   . Frequency of Communication with Friends and Family:   . Frequency of Social Gatherings with Friends and Family:   . Attends Religious Services:   . Active Member of Clubs or Organizations:   . Attends Archivist Meetings:   Marland Kitchen Marital Status:   Intimate Partner Violence:   . Fear of Current or Ex-Partner:   . Emotionally Abused:   Marland Kitchen Physically Abused:   . Sexually Abused:     Outpatient Medications Prior to Visit  Medication Sig Dispense Refill  . ARIPiprazole (ABILIFY) 5 MG tablet Take 1 tablet (5 mg total) by mouth at bedtime. (Patient not taking: Reported on 10/09/2019) 30 tablet 0  . DULoxetine (CYMBALTA) 30 MG capsule Take 1 capsule (30 mg total) by mouth daily. (Patient not taking: Reported on 10/09/2019) 30 capsule 0  . nitrofurantoin, macrocrystal-monohydrate, (MACROBID) 100  MG capsule Take 1 capsule (100 mg total) by mouth 2 (two) times daily. 14 capsule 0   No facility-administered medications prior to visit.    No Known Allergies  ROS Review of Systems  Constitutional: Negative.   Respiratory: Negative.   Cardiovascular: Negative.   Genitourinary: Negative.   Neurological: Negative.   Psychiatric/Behavioral: Negative.       Objective:    Physical Exam HENT:     Head: Normocephalic and atraumatic.  Cardiovascular:     Rate and Rhythm: Normal rate and regular rhythm.     Pulses: Normal pulses.     Heart sounds: Normal heart sounds.  Pulmonary:     Effort: Pulmonary effort is normal.     Breath sounds: Normal breath sounds.  Neurological:     General: No focal deficit present.     Mental Status: He is alert and  oriented to person, place, and time. Mental status is at baseline.  Psychiatric:        Mood and Affect: Mood normal.        Behavior: Behavior normal.        Thought Content: Thought content normal.        Judgment: Judgment normal.     BP 117/84 (BP Location: Left Arm, Patient Position: Sitting, Cuff Size: Large)   Pulse 93   Temp (!) 96.5 F (35.8 C)   Wt 150 lb 14.4 oz (68.4 kg)   BMI 23.29 kg/m  Wt Readings from Last 3 Encounters:  11/12/19 150 lb 14.4 oz (68.4 kg)  10/09/19 158 lb (71.7 kg)  09/20/19 156 lb (70.8 kg)     Health Maintenance Due  Topic Date Due  . HIV Screening  Never done  . TETANUS/TDAP  Never done  . COLONOSCOPY  Never done  . INFLUENZA VACCINE  11/09/2019    There are no preventive care reminders to display for this patient.  No results found for: TSH Lab Results  Component Value Date   WBC 6.2 09/19/2019   HGB 14.1 09/19/2019   HCT 40.7 09/19/2019   MCV 87.7 09/19/2019   PLT 263 09/19/2019   Lab Results  Component Value Date   NA 137 09/19/2019   K 4.0 09/19/2019   CO2 27 09/19/2019   GLUCOSE 95 09/19/2019   BUN 13 09/19/2019   CREATININE 0.85 09/19/2019   BILITOT 0.7 09/19/2019   ALKPHOS 61 09/19/2019   AST 36 09/19/2019   ALT 41 09/19/2019   PROT 7.6 09/19/2019   ALBUMIN 3.5 09/19/2019   CALCIUM 8.8 (L) 09/19/2019   ANIONGAP 8 09/19/2019   Lab Results  Component Value Date   CHOL 132 09/10/2019   Lab Results  Component Value Date   HDL 49 09/10/2019   Lab Results  Component Value Date   LDLCALC 72 09/10/2019   Lab Results  Component Value Date   TRIG 46 09/10/2019   Lab Results  Component Value Date   CHOLHDL 2.7 09/10/2019   Lab Results  Component Value Date   HGBA1C 5.6 09/10/2019      Assessment & Plan:    1. 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  2. Urinary tract infection  symptoms  - Urinalysis; Future - UA/M w/rflx Culture, Routine; Future    Follow-up: Return in about 7 weeks (around 12/31/2019), or if symptoms worsen or fail to improve.  Jianna Drabik Jerold Coombe, NP

## 2019-11-13 LAB — UA/M W/RFLX CULTURE, ROUTINE
Bilirubin, UA: NEGATIVE
Glucose, UA: NEGATIVE
Ketones, UA: NEGATIVE
Leukocytes,UA: NEGATIVE
Nitrite, UA: NEGATIVE
RBC, UA: NEGATIVE
Specific Gravity, UA: 1.028 (ref 1.005–1.030)
Urobilinogen, Ur: 0.2 mg/dL (ref 0.2–1.0)
pH, UA: 5.5 (ref 5.0–7.5)

## 2019-11-13 LAB — MICROSCOPIC EXAMINATION
Bacteria, UA: NONE SEEN
Casts: NONE SEEN /lpf

## 2019-11-26 ENCOUNTER — Emergency Department
Admission: EM | Admit: 2019-11-26 | Discharge: 2019-11-26 | Discharge status: Against Medical Advice | Payer: Self-pay | Attending: Emergency Medicine | Admitting: Emergency Medicine

## 2019-11-26 ENCOUNTER — Encounter: Payer: Self-pay | Admitting: Emergency Medicine

## 2019-11-26 ENCOUNTER — Other Ambulatory Visit: Payer: Self-pay

## 2019-11-26 DIAGNOSIS — M542 Cervicalgia: Secondary | ICD-10-CM | POA: Insufficient documentation

## 2019-11-26 DIAGNOSIS — M549 Dorsalgia, unspecified: Secondary | ICD-10-CM | POA: Insufficient documentation

## 2019-11-26 DIAGNOSIS — F17220 Nicotine dependence, chewing tobacco, uncomplicated: Secondary | ICD-10-CM | POA: Insufficient documentation

## 2019-11-26 DIAGNOSIS — F1510 Other stimulant abuse, uncomplicated: Secondary | ICD-10-CM | POA: Insufficient documentation

## 2019-11-26 LAB — BASIC METABOLIC PANEL
Anion gap: 11 (ref 5–15)
BUN: 13 mg/dL (ref 6–20)
CO2: 24 mmol/L (ref 22–32)
Calcium: 8.6 mg/dL — ABNORMAL LOW (ref 8.9–10.3)
Chloride: 102 mmol/L (ref 98–111)
Creatinine, Ser: <0.3 mg/dL — ABNORMAL LOW (ref 0.61–1.24)
Glucose, Bld: 95 mg/dL (ref 70–99)
Potassium: 3.4 mmol/L — ABNORMAL LOW (ref 3.5–5.1)
Sodium: 137 mmol/L (ref 135–145)

## 2019-11-26 NOTE — ED Triage Notes (Signed)
Pt reports that he wants detox from Meth. Pt states that he has been using intermittently for 20 years and last used PTA as he was walking here.

## 2019-11-26 NOTE — ED Notes (Signed)
Pt was seen by Dr Ricci Barker angry and left

## 2019-11-26 NOTE — ED Provider Notes (Signed)
[facility] Emergency Department Provider Note  ____________________________________________   First MD Initiated Contact with Patient 11/26/19 1745     (approximate)  I have reviewed the triage vital signs and the nursing notes.   HISTORY  Chief Complaint detox   HPI Nicholas Lindsey is a 54 y.o. male with a past medical history of kidney stones, bipolar disorder, and injection IV meth abuse who presents for assessment requesting hospitalization for detox.  Patient states he has been using meth for about 20 years and injects it daily into his arms.  He denies any acute sick symptoms including fevers, chills, cough, vomiting, diarrhea, dysuria, recent traumatic injuries, abdominal pain, back pain, or other acute complaints.  Denies using other illegal drugs or any EtOH today.  Denies SI, HI, hallucinations.         Past Medical History:  Diagnosis Date  . History of kidney stones   . Left breast abscess 2008   I&D required- [place], [place] (per patient)  . Nodule of anterior chest wall 01/19/2017    Patient Active Problem List   Diagnosis Date Noted  . Urinary tract infection symptoms 10/09/2019  . Swelling of forearm 10/09/2019  . Heart rate fast 10/09/2019  . Encounter to establish care 08/26/2019  . Drug abuse, IV ([place]) 08/26/2019  . Aggression 03/17/2019  . Bipolar 1 disorder ([place]) 03/17/2019  . Facial cellulitis 04/03/2018  . Nodule of anterior chest wall 01/19/2017    Past Surgical History:  Procedure Laterality Date  . ABCESS DRAINAGE Left 2008   Breast- [place], [place]    Prior to Admission medications   Medication Sig Start Date End Date Taking? Authorizing Provider  ARIPiprazole (ABILIFY) 5 MG tablet Take 1 tablet (5 mg total) by mouth at bedtime. Patient not taking: Reported on 10/09/2019 09/19/19   Harvest Dark, MD  DULoxetine (CYMBALTA) 30 MG capsule Take 1 capsule (30 mg total) by mouth daily. Patient not taking: Reported on  10/09/2019 09/19/19 10/19/19  Harvest Dark, MD    Allergies Patient has no known allergies.  Family History  Problem Relation Age of Onset  . Diabetes Mother   . Hypertension Mother   . Heart attack Father     Social History Social History   Tobacco Use  . Smoking status: Never Smoker  . Smokeless tobacco: Current User    Types: Chew  . Tobacco comment: 1/2 Can chew Daily  Vaping Use  . Vaping Use: Never used  Substance Use Topics  . Alcohol use: Not Currently    Comment: 12 Beers / Weekly  . Drug use: Yes    Types: Methamphetamines    Review of Systems  Review of Systems  Constitutional: Negative for chills and fever.  HENT: Negative for sore throat.   Eyes: Negative for pain.  Respiratory: Negative for cough and stridor.   Cardiovascular: Negative for chest pain.  Gastrointestinal: Negative for vomiting.  Musculoskeletal: Positive for back pain ( chronic) and neck pain ( chronic).  Skin: Negative for rash.  Neurological: Negative for seizures, loss of consciousness and headaches.  Psychiatric/Behavioral: Positive for substance abuse. Negative for suicidal ideas.  All other systems reviewed and are negative.     ____________________________________________   PHYSICAL EXAM:  VITAL SIGNS: ED Triage Vitals  Enc Vitals Group     BP 11/26/19 1427 130/80     Pulse Rate 11/26/19 1427 70     Resp 11/26/19 1427 20     Temp 11/26/19 1427 98.3 F (36.8  C)     Temp Source 11/26/19 1427 Oral     SpO2 11/26/19 1427 98 %     Weight 11/26/19 1426 152 lb (68.9 kg)     Height 11/26/19 1426 5\' 7"  (1.702 m)     Head Circumference --      Peak Flow --      Pain Score 11/26/19 1426 7     Pain Loc --      Pain Edu? --      Excl. in [place]? --    Vitals:   11/26/19 1427 11/26/19 1800  BP: 130/80 134/71  Pulse: 70 81  Resp: 20 20  Temp: 98.3 F (36.8 C) 98.4 F (36.9 C)  SpO2: 98% 98%   Physical Exam Vitals and nursing note reviewed.  Constitutional:       Appearance: He is well-developed.  HENT:     Head: Normocephalic and atraumatic.     Right Ear: External ear normal.     Left Ear: External ear normal.     Nose: Nose normal.     Mouth/Throat:     Mouth: Mucous membranes are moist.  Eyes:     Conjunctiva/sclera: Conjunctivae normal.  Cardiovascular:     Rate and Rhythm: Normal rate and regular rhythm.     Pulses: Normal pulses.     Heart sounds: No murmur heard.   Pulmonary:     Effort: Pulmonary effort is normal. No respiratory distress.     Breath sounds: Normal breath sounds.  Abdominal:     Palpations: Abdomen is soft.     Tenderness: There is no abdominal tenderness.  Musculoskeletal:     Cervical back: Neck supple.  Skin:    General: Skin is warm and dry.  Neurological:     Mental Status: He is alert.  Psychiatric:        Mood and Affect: Affect is angry.        Thought Content: Thought content does not include homicidal or suicidal ideation.      ____________________________________________   LABS (all labs ordered are listed, but only abnormal results are displayed)  Labs Reviewed  BASIC METABOLIC PANEL - Abnormal; Notable for the following components:      Result Value   Potassium 3.4 (*)    Creatinine, Ser <0.30 (*)    Calcium 8.6 (*)    All other components within normal limits  URINALYSIS, COMPLETE (UACMP) WITH MICROSCOPIC  URINE DRUG SCREEN, QUALITATIVE ([facility] ONLY)    ____________________________________________   PROCEDURES  Procedure(s) performed (including Critical Care):  Procedures   ____________________________________________   INITIAL IMPRESSION / ASSESSMENT AND PLAN / ED COURSE        Patient presents with [place] to history exam requesting meth detox.  Patient is afebrile and hemodynamically stable arrival.  History and exam as above.  Of note after completing my exam and explained to the patient that I was very worried about his IV drug use and believe he required help patient  repeatedly interrupted this examiner stating "you just got admit me now and you cannot discharge me".  When I tried to explain that I did not believe he required acute inpatient hospitalization and that he would likely benefit from close outpatient substance abuse counseling patient became angry and refused to engage in any further conversation.  I explained to the patient that I was happy to have our ED social worker speak with him to help arrange close follow-up as well as any housing or transportation  needs.  Patient refused to engage with this examiner and walked out of the emergency room saying "you are not helping me at all".  Discharge in stable condition.  ____________________________________________   FINAL CLINICAL IMPRESSION(S) / ED DIAGNOSES  Final diagnoses:  Methamphetamine abuse [facility])     ED Discharge Orders    None       Note:  This document was prepared using Dragon voice recognition software and may include unintentional dictation errors.   Lucrezia Starch, MD 11/26/19 1843

## 2019-11-27 ENCOUNTER — Emergency Department
Admission: EM | Admit: 2019-11-27 | Discharge: 2019-11-29 | Disposition: A | Discharge status: Other Acute Inpatient Hospital | Payer: Self-pay | Attending: Emergency Medicine | Admitting: Emergency Medicine

## 2019-11-27 ENCOUNTER — Emergency Department: Admission: EM | Admit: 2019-11-27 | Discharge: 2019-11-27 | Discharge status: Against Medical Advice | Payer: Self-pay

## 2019-11-27 ENCOUNTER — Encounter: Payer: Self-pay | Admitting: Emergency Medicine

## 2019-11-27 DIAGNOSIS — F15129 Other stimulant abuse with intoxication, unspecified: Secondary | ICD-10-CM | POA: Insufficient documentation

## 2019-11-27 DIAGNOSIS — F1520 Other stimulant dependence, uncomplicated: Secondary | ICD-10-CM | POA: Diagnosis present

## 2019-11-27 DIAGNOSIS — F309 Manic episode, unspecified: Secondary | ICD-10-CM | POA: Insufficient documentation

## 2019-11-27 DIAGNOSIS — F1510 Other stimulant abuse, uncomplicated: Secondary | ICD-10-CM | POA: Diagnosis present

## 2019-11-27 DIAGNOSIS — F15959 Other stimulant use, unspecified with stimulant-induced psychotic disorder, unspecified: Secondary | ICD-10-CM

## 2019-11-27 DIAGNOSIS — Z20822 Contact with and (suspected) exposure to covid-19: Secondary | ICD-10-CM | POA: Insufficient documentation

## 2019-11-27 LAB — COMPREHENSIVE METABOLIC PANEL
ALT: 59 U/L — ABNORMAL HIGH (ref 0–44)
AST: 50 U/L — ABNORMAL HIGH (ref 15–41)
Albumin: 3.8 g/dL (ref 3.5–5.0)
Alkaline Phosphatase: 65 U/L (ref 38–126)
Anion gap: 13 (ref 5–15)
BUN: 7 mg/dL (ref 6–20)
CO2: 23 mmol/L (ref 22–32)
Calcium: 9 mg/dL (ref 8.9–10.3)
Chloride: 106 mmol/L (ref 98–111)
Creatinine, Ser: 0.77 mg/dL (ref 0.61–1.24)
GFR calc Af Amer: >60 mL/min (ref >60)
GFR calc non Af Amer: >60 mL/min (ref >60)
Glucose, Bld: 92 mg/dL (ref 70–99)
Potassium: 3.7 mmol/L (ref 3.5–5.1)
Sodium: 142 mmol/L (ref 135–145)
Total Bilirubin: 1.1 mg/dL (ref 0.3–1.2)
Total Protein: 7.6 g/dL (ref 6.5–8.1)

## 2019-11-27 LAB — CBC
HCT: 40.9 % (ref 39.0–52.0)
Hemoglobin: 14.2 g/dL (ref 13.0–17.0)
MCH: 30.3 pg (ref 26.0–34.0)
MCHC: 34.7 g/dL (ref 30.0–36.0)
MCV: 87.4 fL (ref 80.0–100.0)
Platelets: 236 10*3/uL (ref 150–400)
RBC: 4.68 MIL/uL (ref 4.22–5.81)
RDW: 14 % (ref 11.5–15.5)
WBC: 9.5 10*3/uL (ref 4.0–10.5)
nRBC: 0 % (ref 0.0–0.2)

## 2019-11-27 LAB — SALICYLATE LEVEL: Salicylate Lvl: <7 mg/dL — ABNORMAL LOW (ref 7.0–30.0)

## 2019-11-27 LAB — URINE DRUG SCREEN, QUALITATIVE (ARMC ONLY)
Amphetamines, Ur Screen: POSITIVE — AB
Barbiturates, Ur Screen: NOT DETECTED
Benzodiazepine, Ur Scrn: NOT DETECTED
Cannabinoid 50 Ng, Ur ~~LOC~~: NOT DETECTED
Cocaine Metabolite,Ur ~~LOC~~: NOT DETECTED
MDMA (Ecstasy)Ur Screen: NOT DETECTED
Methadone Scn, Ur: NOT DETECTED
Opiate, Ur Screen: NOT DETECTED
Phencyclidine (PCP) Ur S: NOT DETECTED
Tricyclic, Ur Screen: NOT DETECTED

## 2019-11-27 LAB — SARS CORONAVIRUS 2 BY RT PCR (HOSPITAL ORDER, PERFORMED IN ~~LOC~~ HOSPITAL LAB): SARS Coronavirus 2: NEGATIVE

## 2019-11-27 LAB — ETHANOL: Alcohol, Ethyl (B): <10 mg/dL (ref <10)

## 2019-11-27 LAB — ACETAMINOPHEN LEVEL: Acetaminophen (Tylenol), Serum: <10 ug/mL — ABNORMAL LOW (ref 10–30)

## 2019-11-27 MED ORDER — ACETAMINOPHEN 325 MG PO TABS: 650.0000 mg | ORAL_TABLET | ORAL | Status: DC | PRN

## 2019-11-27 MED ORDER — LORAZEPAM 2 MG PO TABS
2.0000 mg | ORAL_TABLET | Freq: Once | ORAL | Status: AC
  Administered 2019-11-27: 2 mg via ORAL
  Filled 2019-11-27: qty 1

## 2019-11-27 MED ORDER — ONDANSETRON HCL 4 MG PO TABS: 4.0000 mg | ORAL_TABLET | Freq: Three times a day (TID) | ORAL | Status: DC | PRN

## 2019-11-27 MED ORDER — ALUM & MAG HYDROXIDE-SIMETH 200-200-20 MG/5ML PO SUSP: 30.0000 mL | Freq: Four times a day (QID) | ORAL | Status: DC | PRN

## 2019-11-27 NOTE — ED Provider Notes (Signed)
[facility] Emergency Department Provider Note  ____________________________________________   First MD Initiated Contact with Patient 11/27/19 2219     (approximate)  I have reviewed the triage vital signs and the nursing notes.   HISTORY  Chief Complaint Manic Behavior    HPI Nicholas Lindsey is a 54 y.o. male  With PMHx bipolar disorder, drug use, here under IVC. Pt had reportedly presented earlier today requesting detox. He became very agitated with MD and left ED. Reports that after that, he used meth again. He arrives under IVC.  Per IVC, PD called for erratic behavior. Pt told PD he had used meth today and had pressured speech, paranoia. He told them the FBI had bugs in his shoes.       Past Medical History:  Diagnosis Date  . History of kidney stones   . Left breast abscess 2008   I&D required- [place], [place] (per patient)  . Nodule of anterior chest wall 01/19/2017    Patient Active Problem List   Diagnosis Date Noted  . Urinary tract infection symptoms 10/09/2019  . Swelling of forearm 10/09/2019  . Heart rate fast 10/09/2019  . Encounter to establish care 08/26/2019  . Drug abuse, IV ([place]) 08/26/2019  . Aggression 03/17/2019  . Bipolar 1 disorder ([place]) 03/17/2019  . Facial cellulitis 04/03/2018  . Nodule of anterior chest wall 01/19/2017    Past Surgical History:  Procedure Laterality Date  . ABCESS DRAINAGE Left 2008   Breast- [place], [place]    Prior to Admission medications   Medication Sig Start Date End Date Taking? Authorizing Provider  ARIPiprazole (ABILIFY) 5 MG tablet Take 1 tablet (5 mg total) by mouth at bedtime. Patient not taking: Reported on 10/09/2019 09/19/19   Harvest Dark, MD  DULoxetine (CYMBALTA) 30 MG capsule Take 1 capsule (30 mg total) by mouth daily. Patient not taking: Reported on 10/09/2019 09/19/19 10/19/19  Harvest Dark, MD    Allergies Patient has no known allergies.  Family History    Problem Relation Age of Onset  . Diabetes Mother   . Hypertension Mother   . Heart attack Father     Social History Social History   Tobacco Use  . Smoking status: Never Smoker  . Smokeless tobacco: Current User    Types: Chew  . Tobacco comment: 1/2 Can chew Daily  Vaping Use  . Vaping Use: Never used  Substance Use Topics  . Alcohol use: Not Currently    Comment: 12 Beers / Weekly  . Drug use: Yes    Types: Methamphetamines    Review of Systems  Review of Systems  Unable to perform ROS: Psychiatric disorder     ____________________________________________  PHYSICAL EXAM:      VITAL SIGNS: ED Triage Vitals [11/27/19 2158]  Enc Vitals Group     BP (!) 144/93     Pulse Rate (!) 106     Resp 20     Temp 99 F (37.2 C)     Temp Source Oral     SpO2 98 %     Weight      Height      Head Circumference      Peak Flow      Pain Score      Pain Loc      Pain Edu?      Excl. in [place]?      Physical Exam Vitals and nursing note reviewed.  Constitutional:      General:  He is not in acute distress.    Appearance: He is well-developed.  HENT:     Head: Normocephalic and atraumatic.  Eyes:     Conjunctiva/sclera: Conjunctivae normal.  Cardiovascular:     Rate and Rhythm: Normal rate and regular rhythm.     Heart sounds: Normal heart sounds.  Pulmonary:     Effort: Pulmonary effort is normal. No respiratory distress.     Breath sounds: No wheezing.  Abdominal:     General: There is no distension.  Musculoskeletal:     Cervical back: Neck supple.  Skin:    General: Skin is warm.     Capillary Refill: Capillary refill takes less than 2 seconds.     Findings: No rash.  Neurological:     Mental Status: He is alert and oriented to person, place, and time.     Motor: No abnormal muscle tone.  Psychiatric:        Attention and Perception: He is inattentive.        Mood and Affect: Mood is anxious. Affect is angry.        Behavior: Behavior is hyperactive.         Thought Content: Thought content is paranoid.        Judgment: Judgment is impulsive.       ____________________________________________   LABS (all labs ordered are listed, but only abnormal results are displayed)  Labs Reviewed  COMPREHENSIVE METABOLIC PANEL - Abnormal; Notable for the following components:      Result Value   AST 50 (*)    ALT 59 (*)    All other components within normal limits  SALICYLATE LEVEL - Abnormal; Notable for the following components:   Salicylate Lvl <8.7 (*)    All other components within normal limits  ACETAMINOPHEN LEVEL - Abnormal; Notable for the following components:   Acetaminophen (Tylenol), Serum <10 (*)    All other components within normal limits  URINE DRUG SCREEN, QUALITATIVE ([facility] ONLY) - Abnormal; Notable for the following components:   Amphetamines, Ur Screen POSITIVE (*)    All other components within normal limits  SARS CORONAVIRUS 2 BY RT PCR (HOSPITAL ORDER, [place] [facility])  ETHANOL  CBC    ____________________________________________  EKG: None ________________________________________  RADIOLOGY All imaging, including plain films, CT scans, and ultrasounds, independently reviewed by me, and interpretations confirmed via formal radiology reads.  ED MD interpretation:   None  Official radiology report(s): No results found.  ____________________________________________  PROCEDURES   Procedure(s) performed (including Critical Care):  Procedures  ____________________________________________  INITIAL IMPRESSION / MDM / [place] / ED COURSE  As part of my medical decision making, I reviewed the following data within the [place] notes reviewed and incorporated, Old chart reviewed, Notes from prior ED visits, and [place] Controlled Substance Database       *Nicholas Lindsey was evaluated in Emergency Department on 11/28/2019 for the symptoms described in  the history of present illness. He was evaluated in the context of the global COVID-19 pandemic, which necessitated consideration that the patient might be at risk for infection with the SARS-CoV-2 virus that causes COVID-19. Institutional protocols and algorithms that pertain to the evaluation of patients at risk for COVID-19 are in a state of rapid change based on information released by regulatory bodies including the CDC and federal and state organizations. These policies and algorithms were followed during the patient's care in the ED.  Some  ED evaluations and interventions may be delayed as a result of limited staffing during the pandemic.*     Medical Decision Making: 54 yo M here with paranoia, erratic behavior likely related to meth abuse vs underlying bipolar disorder. Labs reviewed and are largely unremarkable. UDS+meth. Given his ongoing reported HI, IVC'ed. Will consult Psych.  ____________________________________________  FINAL CLINICAL IMPRESSION(S) / ED DIAGNOSES  Final diagnoses:  Methamphetamine abuse ([place])     MEDICATIONS GIVEN DURING THIS VISIT:  Medications  acetaminophen (TYLENOL) tablet 650 mg (has no administration in time range)  ondansetron (ZOFRAN) tablet 4 mg (has no administration in time range)  alum & mag hydroxide-simeth (MAALOX/MYLANTA) 200-200-20 MG/5ML suspension 30 mL (has no administration in time range)  LORazepam (ATIVAN) tablet 2 mg (2 mg Oral Given 11/27/19 2234)     ED Discharge Orders    None       Note:  This document was prepared using Dragon voice recognition software and may include unintentional dictation errors.   Duffy Bruce, MD 11/28/19 [phone]

## 2019-11-27 NOTE — ED Triage Notes (Signed)
First RN Note: Pt presents to ED via [facility] with c/o detox, per EMS pt shot up meth earlier today. Per EMS pt requesting detox. Per EMS pt was seen yesterday and LWBS.

## 2019-11-27 NOTE — ED Notes (Signed)
Hourly rounding reveals patient in room. No complaints, stable, in no acute distress. Q15 minute rounds and monitoring via [place]over and Officer to continue.   

## 2019-11-27 NOTE — ED Notes (Signed)
Pt. Transferred from Triage to room 20 after dressing out and screening for contraband. Report to include Situation, Background, Assessment and Recommendations from RN. Pt. Oriented to Quad including Q15 minute rounds as well as Engineer, drilling for their protection. Patient is alert and oriented, warm and dry in no acute distress. Patient denies SI, HI, and AVH. Pt. Encouraged to let me know if needs arise.

## 2019-11-27 NOTE — ED Triage Notes (Addendum)
Pt arrived with [employer], IVC paperwork in route. Per [employer] officer, pt was found in park earlier today arguing with brother, brought in to ED where pt left. Police called again by local [employer] where pt was found causing a disturbance. Pt in triage reports FBI is after him due to his brother. Pt believes the FBI has "bug" in his boot and is refusing to be near them. Pt admits to injecting methamphetamine today. Rapid speech, hallucinations observed and flight of idea expressed. Pt denies SI but sts "If they keep on, im going to do something stupid to them." Pt is cooperative in triage.    Belongings include:   1 white shirt 2 brown boots 1 gray jean 1 gray hat 1 white brief  All removed from pt and placed into bag and labeled with pts information.

## 2019-11-27 NOTE — ED Triage Notes (Signed)
Pt visualized angrily leaving from the ER. This RN attempted to speak with patient to verify if patient leaving, pt refusing to speak with staff. Pt visualized walking out of ED at this time.

## 2019-11-28 MED ORDER — MIRTAZAPINE 15 MG PO TBDP
15.0000 mg | ORAL_TABLET | Freq: Every day | ORAL | Status: DC
  Administered 2019-11-28: 15 mg via ORAL
  Filled 2019-11-28 (×2): qty 1

## 2019-11-28 MED ORDER — HALOPERIDOL 0.5 MG PO TABS
0.5000 mg | ORAL_TABLET | Freq: Two times a day (BID) | ORAL | Status: DC
  Administered 2019-11-28 – 2019-11-29 (×3): 0.5 mg via ORAL
  Filled 2019-11-28 (×3): qty 1

## 2019-11-28 MED ORDER — BENZTROPINE MESYLATE 1 MG PO TABS
0.5000 mg | ORAL_TABLET | Freq: Two times a day (BID) | ORAL | Status: DC
  Administered 2019-11-28 – 2019-11-29 (×3): 0.5 mg via ORAL
  Filled 2019-11-28 (×3): qty 1
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
## 2019-11-28 NOTE — BH Assessment (Signed)
Writer spoke with the patient to complete an updated/reassessment. Patient denies SI/HI and AV/H. 

## 2019-11-28 NOTE — ED Notes (Signed)
Hourly rounding reveals patient in room. No complaints, stable, in no acute distress. Q15 minute rounds and monitoring via [place]over and Officer to continue.   

## 2019-11-28 NOTE — BH Assessment (Signed)
Referral information for Psychiatric Hospitalization faxed to;   [facility] [phone]),   . [facility] [phone] option 3), Patient will need to be voluntary. They don't accept IVC patient's.  . [facility] [phone] or [phone])  . [facility] [phone]),   . [facility] [phone] -or- [phone]),   . [facility] ([phone]), Patient will have a out of pocket due to having no insurance.  . [place] [phone])

## 2019-11-28 NOTE — ED Provider Notes (Signed)
Emergency Medicine Observation Re-evaluation Note  Nicholas Lindsey is a 54 y.o. male, seen on rounds today.  Pt initially presented to the ED for complaints of Manic Behavior Currently, the patient is voicing no complaints.  Physical Exam  BP (!) 144/93 (BP Location: Left Arm)   Pulse (!) 106   Temp 99 F (37.2 C) (Oral)   Resp 20   SpO2 98%  Physical Exam General: Resting comfortably Cardiac: Regular rate Lungs: No increased work of breathing Psych: No current agitation or mania  ED Course / MDM  EKG:    I have reviewed the labs performed to date as well as medications administered while in observation.  Recent changes in the last 24 hours include no acute changes.  Plan  Current plan is for psychiatric disposition. Patient is under full IVC at this time.   Paulette Blanch, MD 11/28/19 [phone]

## 2019-11-28 NOTE — Final Progress Note (Signed)
Physician Final Progress Note  Patient ID: Nicholas Lindsey MRN: [id number] DOB/AGE: Nov 07, 1965 54 y.o.  Admit date: 11/27/2019 Admitting provider: No admitting provider for patient encounter. Discharge date: 11/28/2019   Admission Diagnoses:  Methamphetamine intoxication   Discharge Diagnoses:  Active Problems:   * No active hospital problems. *  Methamphetamine intoxication And methamphetamine dependence  Consults:  TTS  Psych MD ER MD   Significant Findings/ Diagnostic Studies:   Meth Positive urine   Procedures:  MD rounds   Discharge/transfer  Condition:  Fair   Disposition:   On IVC pending admission for detox and rehab and possible dual diagnosis   Diet: regular   Discharge Activity: { per rehab rules    ADL's impaired with intoxication Cognition impaired with intoxication Recall impaired now improving Sleep erratic Movements none no shakes and tremors Akathisia none Language normal  Psychomotor activity elevated when intoxicated Handedness not known Gait and station normal Leans  Not known     Oriented times four Consciousness not clouded or fluctuant Appearance disheveled haggard Concentration and attention fair Speech normal Mood and affect anxious Thought process and content ---sees various hallucinations and feels paranoid fearful Judgement insight reliability all poor SI and HI none Fund of knowledge and intelligence ---below average Memory remote recent and immediate okay but has some errors  Abstraction somewhat concrete Rapport and eye contact okay Movements no shakes and tremors   Total time spent taking care of this patient: 30-40 minutes  Signed: Eulas Post 11/28/2019, 3:15 PM

## 2019-11-28 NOTE — ED Notes (Signed)
Pt given lunch meal tray. 

## 2019-11-28 NOTE — BH Assessment (Addendum)
Referral check for Psychiatric Hospitalization:    [facility] ([phone]), 10:49 PM Per Eber Jones, there are no male beds availiable at this time.    [facility] [phone] option 3), Patient will need to be voluntary. They don't accept IVC patient's.   [facility] [phone] or [phone]) 10:51 Per Morey Hummingbird there are no beds avaliable.   [facility] [phone]), 10:53 No Answer   [facility] [phone] -or- [phone]), 10:57 Helyn App asked for a refax. Task completed at 11:45PM    [facility] [phone]), Patient will have a out of pocket due to having no insurance.   [facility] [phone]) 10:54PM There will be no admission staff until 9am.

## 2019-11-28 NOTE — ED Notes (Signed)
VS checked and breakfast meal provided. No other needs found at this moment. 

## 2019-11-29 DIAGNOSIS — F1520 Other stimulant dependence, uncomplicated: Secondary | ICD-10-CM | POA: Diagnosis present

## 2019-11-29 DIAGNOSIS — F15959 Other stimulant use, unspecified with stimulant-induced psychotic disorder, unspecified: Secondary | ICD-10-CM | POA: Diagnosis present

## 2019-11-29 DIAGNOSIS — F1510 Other stimulant abuse, uncomplicated: Secondary | ICD-10-CM | POA: Diagnosis present

## 2019-11-29 NOTE — BH Assessment (Addendum)
Patient has been accepted to [facility].  Patient assigned to [facility] Accepting physician is Dr. Jonelle Sports.  Call report to [phone].  Representative was [employer].   ER Staff is aware of it:  Estill Bamberg, ER Hinda Lenis, Patient's Nurse   Address: [address] [place], [place] [address]

## 2019-11-29 NOTE — ED Notes (Signed)
Belongings given to transport. Pt ambulatory with officers.

## 2019-11-29 NOTE — ED Notes (Signed)
Pt right forearm noted to be swollen - He reports this is from and IV that "went wrong" - Pt has good peripheral pulses and denies any pain with touch or manipulation - No redness or warmth felt

## 2019-11-29 NOTE — ED Provider Notes (Signed)
Emergency Medicine Observation Re-evaluation Note  Nicholas Lindsey is a 54 y.o. male, seen on rounds today.  Pt initially presented to the ED for complaints of Manic Behavior Currently, the patient is resting calmly.  Physical Exam  BP 136/73 (BP Location: Right Arm)   Pulse 95   Temp 98.3 F (36.8 C) (Oral)   Resp 18   SpO2 100%  Physical Exam Vitals and nursing note reviewed.  HENT:     Head: Normocephalic and atraumatic.     Right Ear: External ear normal.     Left Ear: External ear normal.     Nose: Nose normal.  Pulmonary:     Effort: No respiratory distress.  Neurological:     Mental Status: He is alert.  Psychiatric:        Mood and Affect: Mood normal.      ED Course / MDM  EKG:    I have reviewed the labs performed to date as well as medications administered while in observation.  Recent changes in the last 24 hours include none.  Plan  Current plan is IVC pending admission for detox and rehab and possible dual diagnosis  Patient is under full IVC at this time.   Lucrezia Starch, MD 11/29/19 [phone]

## 2019-11-29 NOTE — ED Notes (Signed)
EMTALA reviewed. 

## 2019-11-29 NOTE — BH Assessment (Signed)
Writer followed up with referrals.   [facility] [phone]), Declined, patient will need to pay seven days up front.    [facility] [phone]), Patient will need to be voluntary. They don't accept IVC patient's.   [facility] ([phone] or [phone]), no beds   [facility] [phone]),    [facility] [phone] -or- [phone]), Declined, No programing, substance induce psychosis   [facility] [phone]), Patient will have a out of pocket due to having no insurance.   [facility] [phone])

## 2019-12-10 ENCOUNTER — Other Ambulatory Visit: Payer: Self-pay

## 2019-12-10 ENCOUNTER — Encounter: Payer: Self-pay | Admitting: Emergency Medicine

## 2019-12-10 ENCOUNTER — Emergency Department
Admission: EM | Admit: 2019-12-10 | Discharge: 2019-12-15 | Disposition: A | Discharge status: Home / Self Care | Payer: Self-pay | Attending: Emergency Medicine | Admitting: Emergency Medicine

## 2019-12-10 DIAGNOSIS — F1910 Other psychoactive substance abuse, uncomplicated: Secondary | ICD-10-CM | POA: Diagnosis present

## 2019-12-10 DIAGNOSIS — Z20822 Contact with and (suspected) exposure to covid-19: Secondary | ICD-10-CM | POA: Insufficient documentation

## 2019-12-10 DIAGNOSIS — R4689 Other symptoms and signs involving appearance and behavior: Secondary | ICD-10-CM | POA: Diagnosis present

## 2019-12-10 DIAGNOSIS — F1990 Other psychoactive substance use, unspecified, uncomplicated: Secondary | ICD-10-CM

## 2019-12-10 DIAGNOSIS — F17220 Nicotine dependence, chewing tobacco, uncomplicated: Secondary | ICD-10-CM | POA: Insufficient documentation

## 2019-12-10 DIAGNOSIS — F1520 Other stimulant dependence, uncomplicated: Secondary | ICD-10-CM | POA: Diagnosis present

## 2019-12-10 DIAGNOSIS — F1510 Other stimulant abuse, uncomplicated: Secondary | ICD-10-CM | POA: Diagnosis present

## 2019-12-10 DIAGNOSIS — F15959 Other stimulant use, unspecified with stimulant-induced psychotic disorder, unspecified: Secondary | ICD-10-CM | POA: Diagnosis present

## 2019-12-10 DIAGNOSIS — F22 Delusional disorders: Secondary | ICD-10-CM | POA: Insufficient documentation

## 2019-12-10 DIAGNOSIS — F319 Bipolar disorder, unspecified: Secondary | ICD-10-CM | POA: Diagnosis present

## 2019-12-10 LAB — CBC
HCT: 39.6 % (ref 39.0–52.0)
Hemoglobin: 13.5 g/dL (ref 13.0–17.0)
MCH: 30.4 pg (ref 26.0–34.0)
MCHC: 34.1 g/dL (ref 30.0–36.0)
MCV: 89.2 fL (ref 80.0–100.0)
Platelets: 178 10*3/uL (ref 150–400)
RBC: 4.44 MIL/uL (ref 4.22–5.81)
RDW: 14.2 % (ref 11.5–15.5)
WBC: 5.8 10*3/uL (ref 4.0–10.5)
nRBC: 0 % (ref 0.0–0.2)

## 2019-12-10 LAB — COMPREHENSIVE METABOLIC PANEL
ALT: 68 U/L — ABNORMAL HIGH (ref 0–44)
AST: 51 U/L — ABNORMAL HIGH (ref 15–41)
Albumin: 3.4 g/dL — ABNORMAL LOW (ref 3.5–5.0)
Alkaline Phosphatase: 81 U/L (ref 38–126)
Anion gap: 9 (ref 5–15)
BUN: 13 mg/dL (ref 6–20)
CO2: 23 mmol/L (ref 22–32)
Calcium: 8.3 mg/dL — ABNORMAL LOW (ref 8.9–10.3)
Chloride: 108 mmol/L (ref 98–111)
Creatinine, Ser: 0.63 mg/dL (ref 0.61–1.24)
GFR calc Af Amer: >60 mL/min (ref >60)
GFR calc non Af Amer: >60 mL/min (ref >60)
Glucose, Bld: 100 mg/dL — ABNORMAL HIGH (ref 70–99)
Potassium: 3.8 mmol/L (ref 3.5–5.1)
Sodium: 140 mmol/L (ref 135–145)
Total Bilirubin: 0.9 mg/dL (ref 0.3–1.2)
Total Protein: 6.7 g/dL (ref 6.5–8.1)

## 2019-12-10 LAB — URINE DRUG SCREEN, QUALITATIVE (ARMC ONLY)
Amphetamines, Ur Screen: POSITIVE — AB
Barbiturates, Ur Screen: NOT DETECTED
Benzodiazepine, Ur Scrn: NOT DETECTED
Cannabinoid 50 Ng, Ur ~~LOC~~: NOT DETECTED
Cocaine Metabolite,Ur ~~LOC~~: NOT DETECTED
MDMA (Ecstasy)Ur Screen: NOT DETECTED
Methadone Scn, Ur: NOT DETECTED
Opiate, Ur Screen: NOT DETECTED
Phencyclidine (PCP) Ur S: NOT DETECTED
Tricyclic, Ur Screen: NOT DETECTED

## 2019-12-10 LAB — SALICYLATE LEVEL: Salicylate Lvl: <7 mg/dL — ABNORMAL LOW (ref 7.0–30.0)

## 2019-12-10 LAB — ACETAMINOPHEN LEVEL: Acetaminophen (Tylenol), Serum: <10 ug/mL — ABNORMAL LOW (ref 10–30)

## 2019-12-10 LAB — ETHANOL: Alcohol, Ethyl (B): <10 mg/dL (ref <10)
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
## 2019-12-10 NOTE — ED Notes (Addendum)
Pt removes white t-shirt, grey gym pants, and jeans and all placed in labeled pt belonging bag to be secured on nursing unit and pt changed into burgandy scrubs; pt has bottle augmentin, doxycyclilne and citalopram

## 2019-12-11 DIAGNOSIS — F15959 Other stimulant use, unspecified with stimulant-induced psychotic disorder, unspecified: Secondary | ICD-10-CM

## 2019-12-11 LAB — URINALYSIS, COMPLETE (UACMP) WITH MICROSCOPIC
Bacteria, UA: NONE SEEN
Bilirubin Urine: NEGATIVE
Glucose, UA: NEGATIVE mg/dL
Hgb urine dipstick: NEGATIVE
Ketones, ur: NEGATIVE mg/dL
Leukocytes,Ua: NEGATIVE
Nitrite: NEGATIVE
Protein, ur: NEGATIVE mg/dL
Specific Gravity, Urine: 1.01 (ref 1.005–1.030)
WBC, UA: NONE SEEN WBC/hpf (ref 0–5)
pH: 6 (ref 5.0–8.0)

## 2019-12-11 LAB — SARS CORONAVIRUS 2 BY RT PCR (HOSPITAL ORDER, PERFORMED IN ~~LOC~~ HOSPITAL LAB): SARS Coronavirus 2: NEGATIVE

## 2019-12-11 MED ORDER — ARIPIPRAZOLE 5 MG PO TABS
5.0000 mg | ORAL_TABLET | Freq: Every day | ORAL | Status: DC
  Administered 2019-12-11: 5 mg via ORAL
  Filled 2019-12-11: qty 1

## 2019-12-11 MED ORDER — DIPHENHYDRAMINE HCL 50 MG/ML IJ SOLN: 25.0000 mg | Freq: Once | INTRAMUSCULAR | Status: DC

## 2019-12-11 MED ORDER — HALOPERIDOL 5 MG PO TABS
5.0000 mg | ORAL_TABLET | Freq: Two times a day (BID) | ORAL | Status: DC
  Administered 2019-12-11 – 2019-12-15 (×6): 5 mg via ORAL
  Filled 2019-12-11 (×6): qty 1

## 2019-12-11 MED ORDER — HALOPERIDOL LACTATE 5 MG/ML IJ SOLN: 5.0000 mg | Freq: Once | INTRAMUSCULAR | Status: DC

## 2019-12-11 MED ORDER — DIPHENHYDRAMINE HCL 25 MG PO CAPS
ORAL_CAPSULE | ORAL | Status: AC
  Filled 2019-12-11: qty 1

## 2019-12-11 MED ORDER — DULOXETINE HCL 30 MG PO CPEP
30.0000 mg | ORAL_CAPSULE | Freq: Every day | ORAL | Status: DC
  Administered 2019-12-11 – 2019-12-15 (×4): 30 mg via ORAL
  Filled 2019-12-11 (×5): qty 1

## 2019-12-11 MED ORDER — DIPHENHYDRAMINE HCL 25 MG PO CAPS
25.0000 mg | ORAL_CAPSULE | Freq: Once | ORAL | Status: AC
  Administered 2019-12-11: 25 mg via ORAL

## 2019-12-11 MED ORDER — BENZTROPINE MESYLATE 1 MG PO TABS
1.0000 mg | ORAL_TABLET | Freq: Every day | ORAL | Status: DC
  Administered 2019-12-13 – 2019-12-15 (×3): 1 mg via ORAL
  Filled 2019-12-11 (×3): qty 1

## 2019-12-11 MED ORDER — HALOPERIDOL 5 MG PO TABS
5.0000 mg | ORAL_TABLET | Freq: Once | ORAL | Status: AC
  Administered 2019-12-11: 5 mg via ORAL
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
## 2019-12-11 NOTE — ED Notes (Signed)
Hourly rounding reveals patient in room. No complaints, stable, in no acute distress. Q15 minute rounds and monitoring via Security Cameras to continue. 

## 2019-12-11 NOTE — ED Notes (Signed)
NP and TTS reassessing him at this time - I was in another pts room but I could hear him become verbally aggressive with the staff present in his room  - he was making threats  Staff left the room and he then slammed the door   He could still be heard yelling throughout the unit
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
## 2019-12-11 NOTE — ED Notes (Signed)
IVC/pending overnight obs/re-asses in the AM

## 2019-12-11 NOTE — ED Notes (Signed)
IVC/  PENDING  PLACEMENT 

## 2019-12-11 NOTE — Consult Note (Signed)
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  He immediately began demanding that we send him to another facility.  He was recently in the emergency department for similar behaviors after using meth and sent to [name] psychiatric hospital.  He was released a couple of days ago and return to using meth.  He became agitated and aggressive.  Continues to display hyperactivity.  No suicidal/homicidal ideations, hallucinations.  He did not go to his follow-up appointment after discharge.  This provider let him know he needed to discharge so he could follow-up with his outpatient appointment and/or ADS for substance abuse treatment.  Patient became irate and demanding and cursing.  He was yelling he needed some sleep and slammed the door.  As needed agitation medications given and he slept.  Once he awakens, he should be discharged to follow-up with his outpatient appointment.  Total Time spent with patient: 45 minutes  Past Psychiatric History: Methamphetamine use disorder  Past Medical History:  Past Medical History:  Diagnosis Date  . History of kidney stones   . Left breast abscess 2008   I&D required- [place], [place] (per patient)  . Nodule of anterior chest wall 01/19/2017    Past Surgical History:  Procedure Laterality Date  . ABCESS DRAINAGE Left 2008   Breast- [place], [place]   Family History:  Family History  Problem Relation Age of Onset  . Diabetes Mother   . Hypertension Mother   . Heart attack Father     Family Psychiatric  History: None Social History:  Social History   Substance and Sexual Activity  Alcohol Use Not Currently   Comment: 12 Beers / Weekly     Social History   Substance and Sexual Activity  Drug Use Yes  . Types: Methamphetamines    Social History   Socioeconomic History  . Marital status: Single    Spouse name: Not on file  . Number of children: Not on file  . Years of education: Not on file  . Highest education level: Not on file  Occupational History  . Not on file  Tobacco Use  . Smoking status: Never Smoker  . Smokeless tobacco: Current User    Types: Chew  . Tobacco comment: 1/2 Can chew Daily  Vaping Use  . Vaping Use: Never used  Substance and Sexual Activity  . Alcohol use: Not Currently    Comment: 12 Beers / Weekly  . Drug use: Yes    Types: Methamphetamines  . Sexual activity: Not on file  Other Topics Concern  . Not on file  Social History Narrative  . Not on file   Social Determinants of Health   Financial Resource Strain:   . Difficulty of Paying Living Expenses: Not on file  Food Insecurity:   . Worried About Charity fundraiser in the Last Year: Not on file  . Ran Out of Food in the Last Year: Not on file  Transportation Needs:   . Lack of Transportation (Medical): Not on file  . Lack of Transportation (Non-Medical): Not on file  Physical Activity:   . Days of Exercise per Week: Not on file  . Minutes of Exercise per Session: Not on file  Stress:   . Feeling of Stress : Not on file  Social Connections:   . Frequency of Communication with Friends and Family: Not on file  . Frequency of Social Gatherings with Friends and Family: Not on file  . Attends Religious Services: Not on file  . Active Member of Clubs or Organizations: Not on file  . Attends Archivist Meetings: Not on file  . Marital Status: Not on file    Has this patient used any form of tobacco in the last 30 days? (Cigarettes, Smokeless Tobacco,  Cigars, and/or Pipes) A prescription for an FDA-approved tobacco cessation medication was offered at discharge and the patient refused  Current Medications: Current Facility-Administered Medications  Medication Dose Route Frequency Provider Last Rate Last Admin  . ARIPiprazole (ABILIFY) tablet 5 mg  5 mg Oral QHS [name], NP   5 mg at 12/11/19 0144  . diphenhydrAMINE (BENADRYL) injection 25 mg  25 mg Intramuscular Once [name], NP      . DULoxetine (CYMBALTA) DR capsule 30 mg  30 mg Oral Daily [name], NP   30 mg at 12/11/19 1004  . haloperidol lactate (HALDOL) injection 5 mg  5 mg Intramuscular Once [name], NP       Current Outpatient Medications  Medication Sig Dispense Refill  . ARIPiprazole (ABILIFY) 5 MG tablet Take 1 tablet (5 mg total) by mouth at bedtime. (Patient not taking: Reported on 10/09/2019) 30 tablet 0  . DULoxetine (CYMBALTA) 30 MG capsule Take 1 capsule (30 mg total) by mouth daily. (Patient not taking: Reported on 10/09/2019) 30 capsule 0   PTA Medications: (Not in a hospital admission)   Musculoskeletal: Strength & Muscle Tone: within normal limits Gait & Station: normal Patient leans: N/A  Psychiatric Specialty Exam: Physical Exam Vitals and nursing note reviewed.  Constitutional:      Appearance: Normal appearance.  HENT:     Nose: Nose normal.  Pulmonary:     Effort: Pulmonary effort is normal.  Musculoskeletal:        General: Normal range of motion.     Cervical back: Normal range of motion.  Neurological:     General: No focal deficit present.     Mental Status: He is alert and oriented to person, place, and time.  Psychiatric:        Attention and Perception: He is inattentive.        Mood and Affect: Mood is anxious. Affect is angry.        Speech: Speech normal.        Behavior: Behavior is agitated.        Thought Content: Thought content normal.        Cognition and Memory: Cognition and memory normal.         Judgment: Judgment is impulsive.     Review of Systems  Neurological: Positive for speech difficulty.  Psychiatric/Behavioral: Positive for behavioral problems. The patient is nervous/anxious.   All other systems reviewed and are negative.   Blood pressure 140/90, pulse 71, temperature 98 F (36.7 C), temperature source Oral, resp. rate 18, height 5\' 7"  (1.702 m), weight 65.8 kg, SpO2 98 %.Body mass index is 22.71 kg/m.  General Appearance: Disheveled  Eye Contact:  Good  Speech:  Normal Rate  Volume:  Increased  Mood:  Angry and Anxious  Affect:  Appropriate  Thought Process:  Coherent  Orientation:  Full (Time, Place, and Person)  Thought Content:  WDL and Logical  Suicidal Thoughts:  No  Homicidal Thoughts:  No  Memory:  Immediate;   Fair Recent;   Fair  Judgement:  Poor  Insight:  Lacking  Psychomotor Activity:  Increased  Concentration:  Concentration: Fair and Attention Span: Fair  Recall:  [employer] of Knowledge:  Fair  Language:  Fair  Akathisia:  No  Handed:  Right  AIMS (if indicated):     Assets:  Leisure Time Physical Health Resilience  ADL's:  Intact  Cognition:  WNL  Sleep:        Demographic Factors:  Male and Caucasian  Loss Factors: NA  Historical Factors: Impulsivity  Risk Reduction Factors:   Sense of responsibility to family, Living with another person, especially a relative and Positive social support  Continued Clinical Symptoms:  Anxiety, mild  Cognitive Features That Contribute To Risk:  None    Suicide Risk:  Minimal: No identifiable suicidal ideation.  Patients presenting with no risk factors but with morbid ruminations; may be classified as minimal risk based on the severity of the depressive symptoms    Plan Of Care/Follow-up recommendations:  Methamphetamine induced psychosis: -One-time dose of Haldol 5 mg and Benadryl 50 mg p.o. -Started Haldol 5 mg twice daily -Discontinued Abilify 5 mg daily -Refrain from  alcohol and drug use -Follow-up with ADS after discharge -Attend 12-step program with a sponsor  Depression: -Continue Cymbalta 30 mg daily  Activity:  As tolerated Diet:  Heart healthy  Disposition: Discharge home when he awakens Waylan Boga, NP 12/11/2019, 9:29 PM

## 2019-12-11 NOTE — Discharge Instructions (Addendum)
Plan Of Care/Follow-up recommendations:  Methamphetamine induced psychosis: -Started Haldol 5 mg twice daily -Discontinued Abilify 5 mg daily -Refrain from alcohol and drug use -Follow-up with ADS after discharge -Attend 12-step program with a sponsor  Alcohol and Drug Services Drug Rehab  Alcohol Treatment  Dual Diagnosis   [address] [place] [address]

## 2019-12-11 NOTE — TOC Progression Note (Signed)
Transition of Care [facility]) - Progression Note    Patient Details  Name: Nicholas Lindsey MRN: [id number] Date of Birth: 04-Oct-1965  Transition of Care [facility]) CM/SW [place], [place] Phone Number: [phone] 12/11/2019, 1:43 PM  Clinical Narrative:     Patient currently IVC CWS spoke with Milus Glazier [place] [phone], assigned to this patient, requesting an update on this patient.  This CSW updated Ms. Ronnald Ramp on patient IVC and psychiatric reassessment. Ms. Ronnald Ramp will come by to visit with patient, but she did not give this CSW a date and time.       Expected Discharge Plan and Services                                                 Social Determinants of Health (SDOH) Interventions    Readmission Risk Interventions No flowsheet data found.

## 2019-12-11 NOTE — ED Notes (Signed)
Report to include Situation, Background, Assessment, and Recommendations received from [facility]. Patient alert and oriented, warm and dry, in no acute distress. Patient denies SI, HI, AVH and pain. Patient made aware of Q15 minute rounds and security cameras for their safety. Patient instructed to come to me with needs or concerns.

## 2019-12-11 NOTE — BH Assessment (Signed)
Writer spoke with the patient to complete an updated/reassessment. Initially upon interview the patient was calm/cooperative. Upon learning about his possible discharge the patient became irrate and began screaming/cursing obscenities. The patient then slammed his door aggressively. Patient denied SI/HI/and AV/H. Per Roselyn Reef, L patient no longer meet inpatient criteria.

## 2019-12-12 MED ORDER — DULOXETINE HCL 30 MG PO CPEP: 30.0000 mg | ORAL_CAPSULE | Freq: Every day | ORAL | 0 refills | Status: DC

## 2019-12-12 MED ORDER — HALOPERIDOL 5 MG PO TABS: 5.0000 mg | ORAL_TABLET | Freq: Two times a day (BID) | ORAL | 0 refills | Status: DC

## 2019-12-12 NOTE — TOC Progression Note (Addendum)
Transition of Care [facility]) - Progression Note    Patient Details  Name: Nicholas Lindsey MRN: [id number] Date of Birth: 01/05/1966  Transition of Care [facility]) CM/SW [place], RN Phone Number: 12/12/2019, 1:43 PM  Clinical Narrative:    Received call from [place] [phone] confirming patient remains in hospital and states she is coming to talk with patient this afternoon after attending a funeral.         Expected Discharge Plan and Services                                                 Social Determinants of Health (SDOH) Interventions    Readmission Risk Interventions No flowsheet data found.

## 2019-12-12 NOTE — ED Notes (Signed)
He refused his am medications and he refused to allow me to take his VS

## 2019-12-12 NOTE — ED Provider Notes (Signed)
Emergency Medicine Observation Re-evaluation Note  Nicholas Lindsey is a 54 y.o. male, seen on rounds today.  Pt initially presented to the ED for complaints of Mental Health Problem Currently, the patient is resting calmly. Eating breakfast.  Physical Exam  BP (!) 141/83 (BP Location: Right Arm)   Pulse 69   Temp 98.3 F (36.8 C) (Oral)   Resp 17   Ht 5\' 7"  (1.702 m)   Wt 65.8 kg   SpO2 98%   BMI 22.71 kg/m  Physical Exam Vitals and nursing note reviewed.  Constitutional:      Appearance: Normal appearance. He is normal weight.  HENT:     Head: Normocephalic and atraumatic.     Right Ear: External ear normal.     Left Ear: External ear normal.     Nose: Nose normal.  Cardiovascular:     Rate and Rhythm: Normal rate.  Pulmonary:     Effort: No respiratory distress.  Neurological:     Mental Status: He is alert and oriented to person, place, and time.  Psychiatric:        Mood and Affect: Mood normal.      ED Course / MDM  EKG:    I have reviewed the labs performed to date as well as medications administered while in observation.  Recent changes in the last 24 hours include none.  Plan  Current plan is for discharge.  Patient is not under full IVC at this time.   Lucrezia Starch, MD 12/12/19 [phone]

## 2019-12-12 NOTE — ED Notes (Signed)
He has had a visit from [place] observed by me

## 2019-12-12 NOTE — ED Notes (Signed)
Pt status is that he is ready for discharge  - he is still under IVC  Provider aware  Discharge delayed

## 2019-12-12 NOTE — ED Notes (Signed)
He is verbalizing suicidal ideation  "I am going to kill myself if I cannot get into a hospital for some help"

## 2019-12-12 NOTE — ED Notes (Signed)
Pt given dinner tray, grape juice and sprite.

## 2019-12-13 NOTE — ED Notes (Signed)
Report to include Situation, Background, Assessment, and Recommendations received from [facility]. Patient alert and oriented, warm and dry, in no acute distress. Patient denies SI, HI, AVH and pain. Patient made aware of Q15 minute rounds and security cameras for their safety. Patient instructed to come to me with needs or concerns.

## 2019-12-13 NOTE — ED Notes (Signed)
Hourly rounding reveals patient sleeping in room. No complaints, stable, in no acute distress. Q15 minute rounds and monitoring via Security Cameras to continue. 

## 2019-12-13 NOTE — ED Notes (Signed)
Report to include Situation, Background, Assessment, and Recommendations received from Amy RN. Patient alert and oriented, warm and dry, in no acute distress. Patient made aware of Q15 minute rounds and security cameras for their safety. Patient instructed to come to me with needs or concerns.

## 2019-12-13 NOTE — ED Notes (Signed)
Sprite provided per pt request, pt is ambulatory to the bathroom

## 2019-12-13 NOTE — ED Provider Notes (Signed)
Emergency Medicine Observation Re-evaluation Note  Nicholas Lindsey is a 54 y.o. male, seen on rounds today.  Pt initially presented to the ED for complaints of Mental Health Problem Currently, the patient is watching TV in bed, denies any complaints.  Physical Exam  BP (!) 161/97 (BP Location: Left Arm)   Pulse 78   Temp (!) 97.3 F (36.3 C) (Oral)   Resp 18   Ht 5\' 7"  (1.702 m)   Wt 65.8 kg   SpO2 98%   BMI 22.71 kg/m  Physical Exam Constitutional: Resting comfortably. Eyes: Conjunctivae are normal. Head: Atraumatic. Nose: No congestion/rhinnorhea. Mouth/Throat: Mucous membranes are moist. Neck: Normal ROM Cardiovascular: No cyanosis noted. Respiratory: Normal respiratory effort. Gastrointestinal: Non-distended. Genitourinary: deferred Musculoskeletal: No lower extremity tenderness nor edema. Neurologic:  Normal speech and language. No gross focal neurologic deficits are appreciated. Skin:  Skin is warm, dry and intact. No rash noted.    ED Course / MDM  EKG:    I have reviewed the labs performed to date as well as medications administered while in observation.  Recent changes in the last 24 hours include none.  Plan  Current plan is for further assistance from social work. Patient is not under full IVC at this time.   Blake Divine, MD 12/13/19 [phone]

## 2019-12-13 NOTE — ED Notes (Signed)
Hourly rounding reveals patient lying in bed. No complaints, stable, in no acute distress. Q15 minute rounds and monitoring via Engineer, drilling to continue.

## 2019-12-13 NOTE — ED Notes (Signed)
Hourly rounding reveals patient lying in bed watching TV. No complaints, stable, in no acute distress. Q15 minute rounds and monitoring via Engineer, drilling to continue.

## 2019-12-13 NOTE — ED Notes (Signed)
Assessment completed  Shower offered - he declined at this time  Encouraged him to let me know when/if he wants to shower later

## 2019-12-13 NOTE — ED Notes (Signed)
Hourly rounding reveals patient awake in room. No complaints, stable, in no acute distress. Q15 minute rounds and monitoring via Security Cameras to continue. 

## 2019-12-13 NOTE — ED Notes (Signed)
Snack and beverage given. 

## 2019-12-13 NOTE — ED Notes (Signed)
IVC pending sw placement

## 2019-12-13 NOTE — ED Notes (Signed)
Hourly rounding reveals patient in room. No complaints, stable, in no acute distress. Q15 minute rounds and monitoring via Security Cameras to continue. 

## 2019-12-14 MED ORDER — LORAZEPAM 2 MG/ML IJ SOLN
2.0000 mg | Freq: Once | INTRAMUSCULAR | Status: AC
  Administered 2019-12-14: 2 mg via INTRAMUSCULAR

## 2019-12-14 MED ORDER — DIPHENHYDRAMINE HCL 50 MG/ML IJ SOLN
50.0000 mg | Freq: Once | INTRAMUSCULAR | Status: AC
  Administered 2019-12-14: 50 mg via INTRAMUSCULAR

## 2019-12-14 MED ORDER — HALOPERIDOL LACTATE 5 MG/ML IJ SOLN
5.0000 mg | Freq: Once | INTRAMUSCULAR | Status: AC
  Administered 2019-12-14: 5 mg via INTRAMUSCULAR

## 2019-12-14 NOTE — ED Notes (Signed)
Hourly rounding reveals patient sleeping in room. No complaints, stable, in no acute distress. Q15 minute rounds and monitoring via Security Cameras to continue. 

## 2019-12-14 NOTE — ED Notes (Signed)
Pt given a Sprite.

## 2019-12-14 NOTE — TOC Transition Note (Signed)
Transition of Care [facility]) - CM/SW Discharge Note   Patient Details  Name: Nicholas Lindsey MRN: [id number] Date of Birth: Aug 09, 1965  [facility] CM/SW Contact:  Maebelle Munroe, RN Phone Number: 12/14/2019, 5:32 PM   Clinical Narrative:     [facility] team Case Manager spoke to pt about plan for discharge. Pt states he wants a long term IP treatment facility. He shares that [facility] years ago. He would like to return there if possible. Case Manager will provide resources to patient.        Patient Goals and CMS Choice        Discharge Placement                       Discharge Plan and Services                                     Social Determinants of Health (SDOH) Interventions     Readmission Risk Interventions No flowsheet data found.

## 2019-12-14 NOTE — ED Notes (Signed)
This RN into the room to take vital signs and see what patient would like for snack. Pt agreeable to vital signs and requested ice cream and sprite for snack. After leaving the room pt heard to be talking loudly and this RN returned to the room and pt started to bang his head on the wall. This RN made several attempts to calm the pt and redirect but unsuccessful. Panic alarm engaged to get assistance to control the patient. Dr Quentin Cornwall called to request medications. Orders received.

## 2019-12-14 NOTE — ED Notes (Signed)
Pt given meal tray.

## 2019-12-14 NOTE — ED Notes (Signed)
Pt needing resources to find a rehab facility.

## 2019-12-14 NOTE — ED Notes (Addendum)
SW gave patient contact numbers  to call in the morning and find a bed in a rehab facility.

## 2019-12-14 NOTE — ED Provider Notes (Signed)
Emergency Medicine Observation Re-evaluation Note  Nicholas Lindsey is a 54 y.o. male, seen on rounds today.  Pt initially presented to the ED for complaints of Mental Health Problem Currently, the patient is resting in bed eating breakfast. Denies any acute complaints..  Physical Exam  BP 124/87 (BP Location: Right Arm)   Pulse 78   Temp (!) 97.4 F (36.3 C) (Oral)   Resp 16   Ht 5\' 7"  (1.702 m)   Wt 65.8 kg   SpO2 99%   BMI 22.71 kg/m  Physical Exam Vitals and nursing note reviewed.  HENT:     Head: Normocephalic and atraumatic.     Right Ear: External ear normal.     Left Ear: External ear normal.     Nose: Nose normal.  Cardiovascular:     Pulses: Normal pulses.  Pulmonary:     Effort: Pulmonary effort is normal.  Neurological:     Mental Status: He is alert.  Psychiatric:        Mood and Affect: Mood normal.      ED Course / MDM  EKG:    I have reviewed the labs performed to date as well as medications administered while in observation.  Recent changes in the last 24 hours include none.  Plan  Current plan is for awaiting social worker placement. Cleared by psych. Patient is not under full IVC at this time.   Lucrezia Starch, MD 12/14/19 [phone]

## 2019-12-14 NOTE — Final Progress Note (Signed)
Physician Final Progress Note  Patient ID: Nicholas Lindsey MRN: [id number] DOB/AGE: March 01, 1966 54 y.o.  Admit date: 12/10/2019 Admitting provider: No admitting provider for patient encounter. 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   Specialty: Gerontology Why: As needed Contact information: [name] [address] [phone]              total time spent taking care of this patient: 30 ---minutes         Mental Status   Alert cooperative oriented times four Mood improving  Oriented --to person place and time  Consciousness not clouded or fluctuant Concentration and attention better Consciousness not clouded or fluctuant\ No frank psychosis or mania Judgement insight reliability fair Rapport and eye contact okay  Memory as normal as possible No movements shakes tremors Appearance --haggard forlorn unkept  Abstraction okay  Speech normal rate tone volume  SI and HI contracts for safety    Leans n/a Handedness not known  Cognition impaired when intoxicated Recall okay  Psychomotor activity okay Musculoskeletal  Normal Gait and station okay  Akathisia none Language normal        IVC rescinded SW not TTS will help him find bed tonight for  placement with cab or bus             Signed: Eulas Post 12/14/2019, 4:00 PM

## 2019-12-14 NOTE — ED Notes (Signed)
Breakfast tray given. °

## 2019-12-14 NOTE — ED Provider Notes (Addendum)
The patient has been evaluated at bedside by Dr. Janese Banks,, psychiatry.  Patient is clinically stable.  Not felt to be a danger to self or others.  No SI or Hi.  No indication for inpatient psychiatric admission at this time.  Appropriate for continued outpatient therapy.    Merlyn Lot, MD 12/14/19 1538  Upon discussing discharge patient became increasingly agitated started slapping his head against the wall.  Difficult to redirect.  Due to his escalating behavior and concern for causing self injury patient was given IM calming medication.  We will continue to monitor.   Merlyn Lot, MD 12/14/19 1946

## 2019-12-14 NOTE — ED Notes (Signed)
Extra security Deputy, [facility], Charge RN Lea  and other nursing staff into the unit. Meds being pulled and [place], RN into unit to talk to the pt. Pt agrees to shot to help calm him. Pt states he just wants help and wants to go to inpatient mental health facility.

## 2019-12-15 MED ORDER — HALOPERIDOL 5 MG PO TABS: 5.0000 mg | ORAL_TABLET | Freq: Two times a day (BID) | ORAL | 0 refills | Status: DC

## 2019-12-15 MED ORDER — DULOXETINE HCL 30 MG PO CPEP: 30.0000 mg | ORAL_CAPSULE | Freq: Every day | ORAL | 0 refills | Status: DC

## 2019-12-15 NOTE — ED Notes (Signed)
Patient ate breakfast with beverage, no signs of distress at this time.

## 2019-12-15 NOTE — TOC Progression Note (Addendum)
Transition of Care [facility]) - Progression Note    Patient Details  Name: Nicholas Lindsey MRN: [id number] Date of Birth: [birth year]  [facility]) CM/SW Contact  Anselm Pancoast, RN Phone Number: 12/15/2019, 11:15 AM  Clinical Narrative:    Patient reports he is now homeless as he is unable to return to his brother, [place] where he was previously staying. TOC RN CM contacted Jeff-brother on file who states he is currently living with someone else and unable to assist.   TOC RN CM will outreach to [place].   LVMM for [employer] requesting bed availability.   Confirmed availability at [facility]. Patient states he has never been there but is willing to go. States he has no id but is open to getting one with assistance. Birth Certificate is at his brothers house but available.   Will arrange transport to [place] once psych cleared for discharge.         Expected Discharge Plan and Services                                                 Social Determinants of Health (SDOH) Interventions    Readmission Risk Interventions No flowsheet data found.

## 2019-12-15 NOTE — TOC Progression Note (Addendum)
Transition of Care [facility]) - Progression Note    Patient Details  Name: Nicholas Lindsey MRN: [id number] Date of Birth: 1966/03/20  Transition of Care [facility]) CM/SW [place], RN Phone Number: 12/15/2019, 9:53 AM  Clinical Narrative:    Provided SA resources and contact information for patient to follow up outpatient for inpatient treatment facility. RN and EDP notified patient cleared for discharge from [facility] standpoint.   EDP advised patient had escalated night before and was going to be reevaluated by psych prior to discharge.        Expected Discharge Plan and Services                                                 Social Determinants of Health (SDOH) Interventions    Readmission Risk Interventions No flowsheet data found.

## 2019-12-15 NOTE — ED Provider Notes (Signed)
-----------------------------------------   11:54 AM on 12/15/2019 -----------------------------------------  Social work has arranged for the patient to go to the [employer].  They have arrange transportation as well.  On reassessment now, the patient is sitting up, eating a meal, and behaving appropriately.  He is calm and cooperative.  I discussed the disposition plan with him, and he feels comfortable going home.  He denies SI or HI.  He was cleared by Dr. Janese Banks from psychiatry yesterday although then had a behavioral outburst when he was to be discharged.  However, he states he feels well now and is comfortable with the discharge plan.  He does not demonstrate acute danger to self or others.  He is appropriate for discharge at this time.    Arta Silence, MD 12/15/19 1155

## 2019-12-15 NOTE — ED Notes (Signed)
Patient voiced understanding of discharge instructions, he was given all of His belongings, prescriptions, and He had 3 medications in bottles that were given back to him. Patient was given a taxi voucher to get to the [facility] in [place], social worker Manuela Schwartz) had it prearranged that He would have a bed once He got there. Nurse offered prayers as He was being discharged, and He gladly received.

## 2019-12-15 NOTE — ED Provider Notes (Addendum)
Emergency Medicine Observation Re-evaluation Note  Nicholas Lindsey is a 54 y.o. male, seen on rounds today.  Pt initially presented to the ED for complaints of Mental Health Problem Currently, the patient is resting comfortably.  He states he feels drowsy but denies acute complaints.  Physical Exam  BP 93/62 (BP Location: Left Arm)   Pulse 87   Temp 98.1 F (36.7 C) (Oral)   Resp 16   Ht 5\' 7"  (1.702 m)   Wt 65.8 kg   SpO2 97%   BMI 22.71 kg/m  Physical Exam General: Resting comfortably. Cardiac: Good peripheral circulation. Lungs: Normal respiratory effort. Psych: Calm and cooperative.  ED Course / MDM  EKG:    I have reviewed the labs performed to date as well as medications administered while in observation.  The patient was to be discharged yesterday, but then became very agitated, was slapping his head on the wall, and ultimately required IM medications for calming yesterday afternoon.  Since that time, there have been no further acute events.  He had previously been cleared by psychiatry.  He is awaiting disposition per social work.  On exam at this time he is calm and cooperative and denies acute complaints.  Plan  Current plan is for reassessment and social work disposition Patient is not under full IVC at this time.   Arta Silence, MD 12/15/19 1128    Arta Silence, MD 12/15/19 1154

## 2019-12-15 NOTE — TOC Progression Note (Addendum)
Transition of Care [facility]) - Progression Note    Patient Details  Name: Nicholas Lindsey MRN: [id number] Date of Birth: 03/16/1966  Transition of Care [facility]) CM/SW [place], RN Phone Number: 12/15/2019, 12:42 PM  Clinical Narrative:    Unable to arrange [facility] due to [place]. [employer] system is also closed for holiday. No family available for transportation. Will issue taxi voucher for transport to [employer] to prevent patient staying another night in emergency department.   ED RN aware of need for taxi voucher.         Expected Discharge Plan and Services                                                 Social Determinants of Health (SDOH) Interventions    Readmission Risk Interventions No flowsheet data found.

## 2019-12-18 ENCOUNTER — Telehealth: Payer: Self-pay

## 2019-12-18 NOTE — Telephone Encounter (Signed)
Nicholas Lindsey, patients brother, called to notify us that [facility] transferred patient to [facility] about 3 days ago.

## 2020-01-07 ENCOUNTER — Ambulatory Visit: Payer: Self-pay | Admitting: Gerontology

## 2020-05-07 ENCOUNTER — Telehealth: Payer: Self-pay | Admitting: Student in an Organized Health Care Education/Training Program

## 2020-05-07 NOTE — Telephone Encounter (Signed)
This is not a patient of Dr Holley Raring.

## 2020-11-12 IMAGING — CT CT HEAD W/O CM
4 of 5 series · 15 of 47 positions shown, 17 images · non-contrast
Comparison: Report from head CT 03/09/1999 (images unavailable).

CLINICAL DATA: Head trauma, headache. Neck trauma, uncomplicated.
Facial trauma. Patient reports fall, hitting nose.

EXAM:
CT HEAD WITHOUT CONTRAST
CT MAXILLOFACIAL WITHOUT CONTRAST
CT CERVICAL SPINE WITHOUT CONTRAST
TECHNIQUE: Multidetector CT imaging of the head, cervical spine, and
maxillofacial structures were performed using the standard protocol
without intravenous contrast. Multiplanar CT image reconstructions
of the cervical spine and maxillofacial structures were also
generated.

[Series 2: head bone · axial · 0.47mm/px · z∈[-119,-85]mm · 3 of 77 slices shown]
[im 9/77  bone]
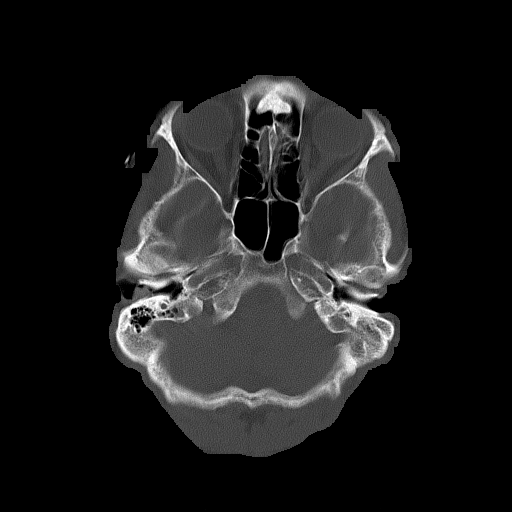
[im 17/77  bone]
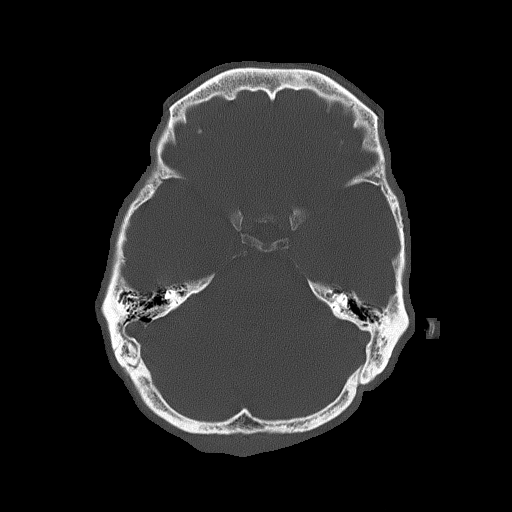
[im 26/77  bone]
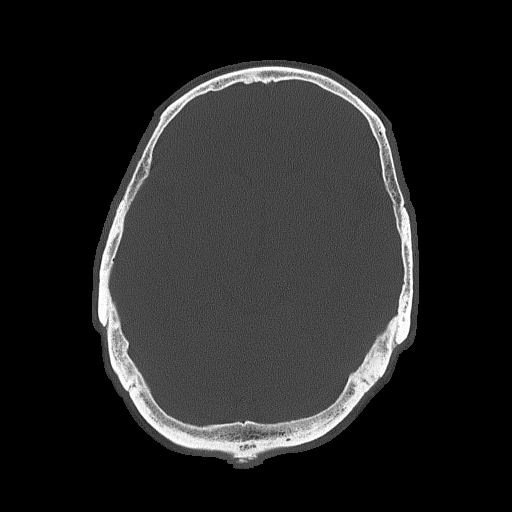

[Series 3: head wo · axial · 0.47mm/px · z∈[-115,-10]mm · 6 of 31 slices shown, 8 images]
[im 5/31  brain]
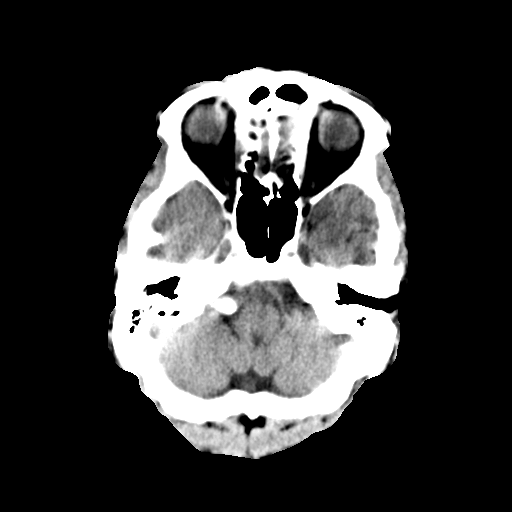
[im 5/31  bone]
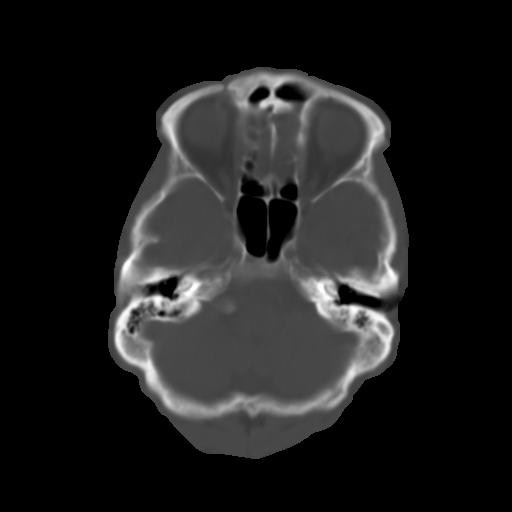
[im 9/31  brain]
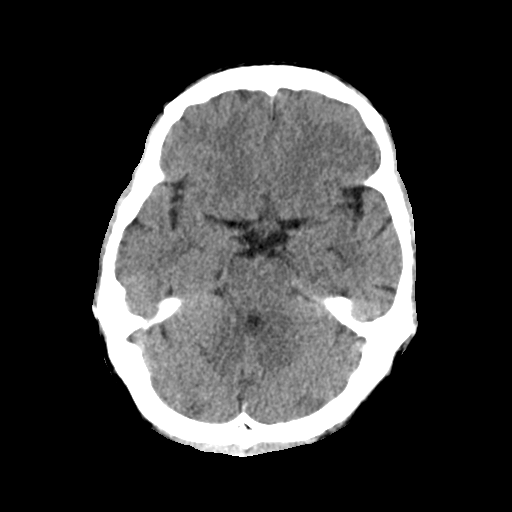
[im 13/31  brain]
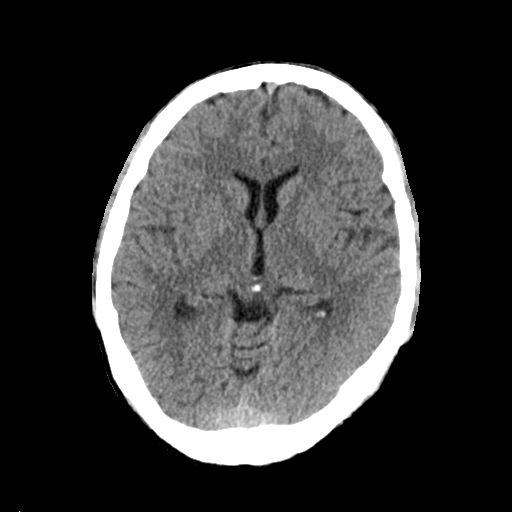
[im 18/31  brain]
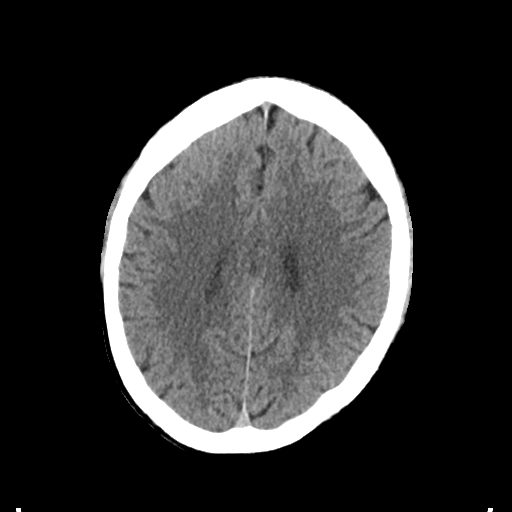
[im 22/31  brain]
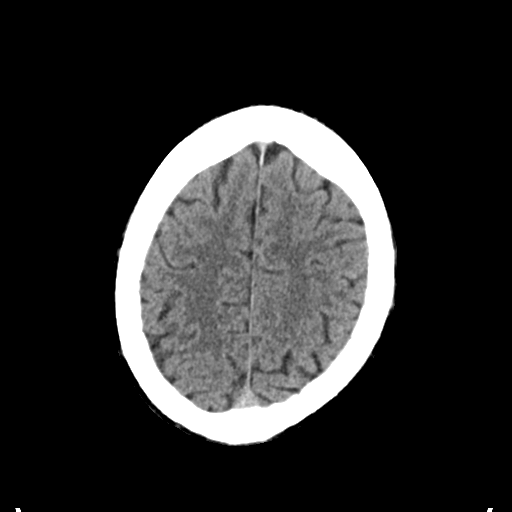
[im 22/31  bone]
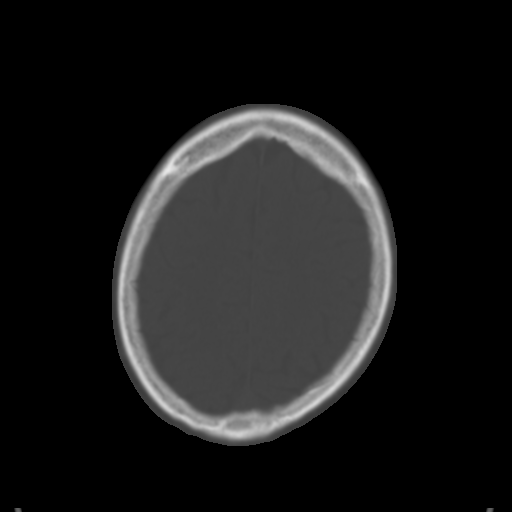
[im 26/31  brain]
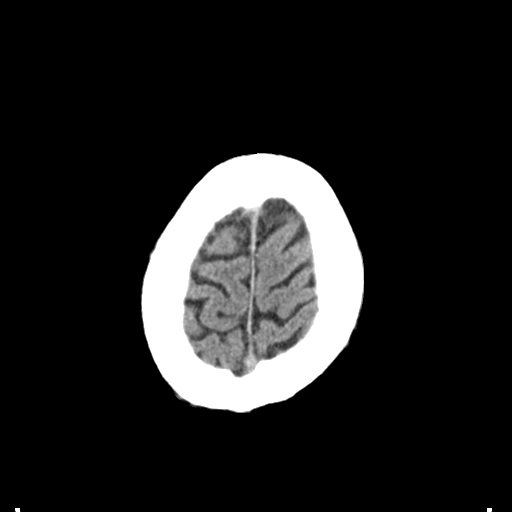

[Series 4: coronal soft tissue · coronal · 0.31mm/px · 3 of 65 slices shown]
[im 22/65  brain]
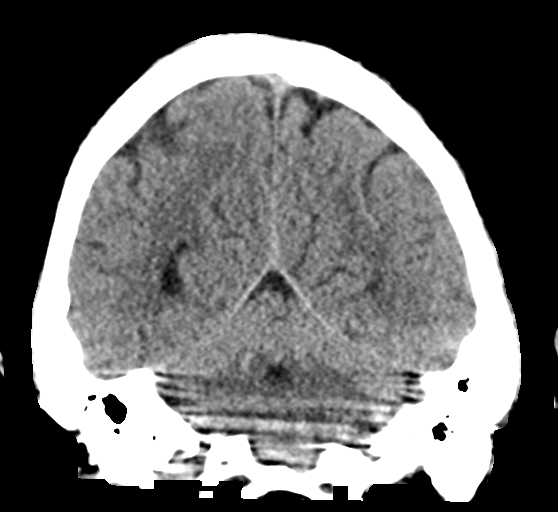
[im 29/65  brain]
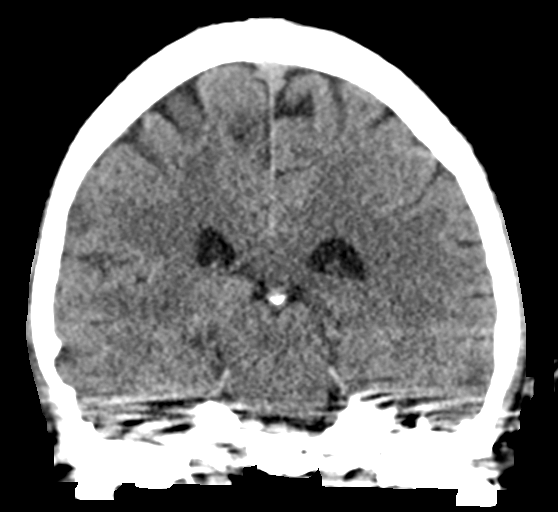
[im 36/65  brain]
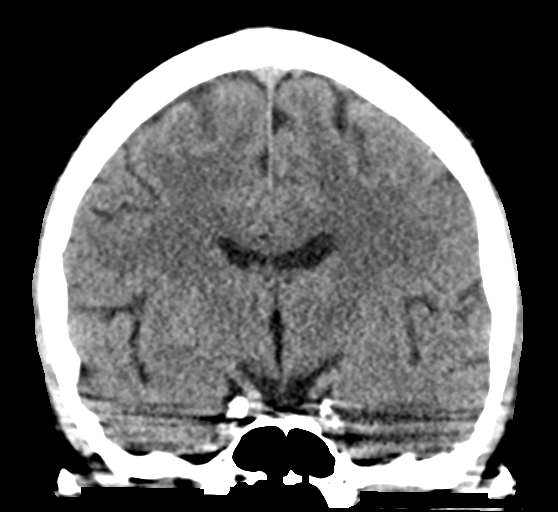

[Series 5: sagittal soft tissue · sagittal · 0.31mm/px · 3 of 58 slices shown]
[im 20/58  brain]
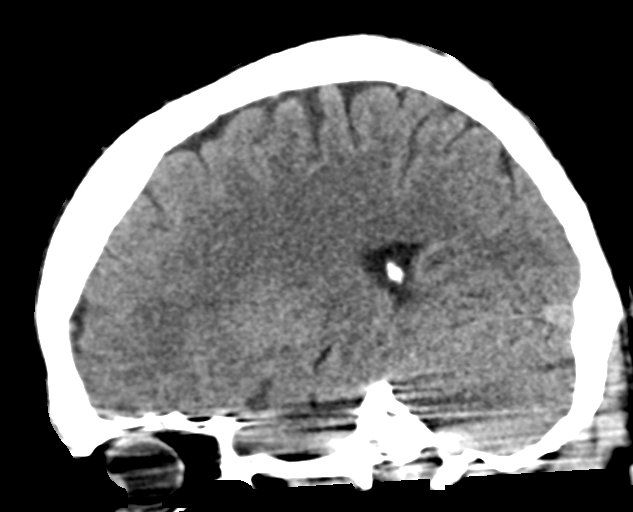
[im 29/58  brain]
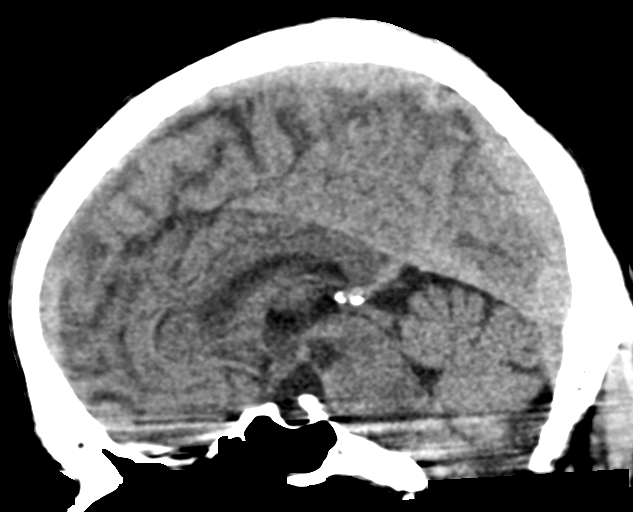
[im 39/58  brain]
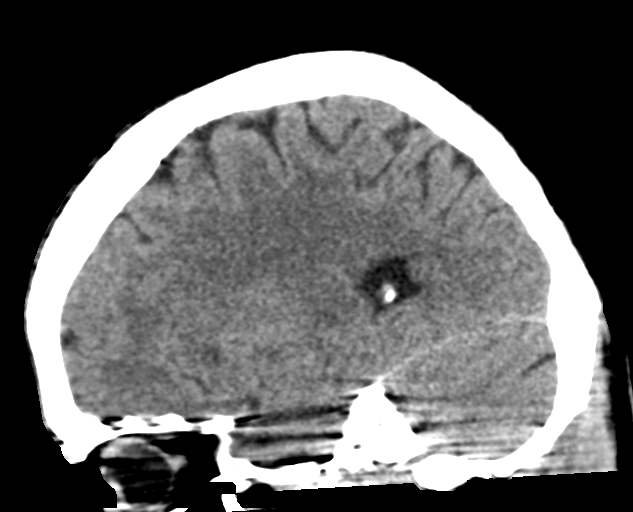

[15 of 47 positions shown; findings below may reference images not displayed]

FINDINGS: CT HEAD FINDINGS

Brain:

Mildly motion degraded examination.

There is no acute intracranial hemorrhage.

No demarcated cortical infarct.

No extra-axial fluid collection.

No evidence of intracranial mass.

No midline shift.

Vascular: No hyperdense vessel.

Skull: Normal. Negative for fracture or focal lesion.

CT MAXILLOFACIAL FINDINGS

Osseous: No acute maxillofacial fracture is demonstrated.

Orbits: The globes are normal in size and contour. The extraocular
muscles are symmetric and unremarkable.

Sinuses: Mild ethmoid and left maxillary sinus mucosal thickening.

Soft tissues: Mild soft tissue swelling is questioned along the
bridge of the nose.

Other: Poor dentition with multiple absent, carious and fractured
teeth as well as multifocal periapical lucencies.

CT CERVICAL SPINE FINDINGS

Alignment: No significant spondylolisthesis.

Skull base and vertebrae: The basion-dental and atlanto-dental
intervals are maintained.No evidence of acute fracture to the
cervical spine.

Soft tissues and spinal canal: No visible canal hematoma or
prevertebral soft tissue swelling.

Disc levels: Cervical spondylosis. Most notably at C6-C7 there is
moderate/advanced disc space narrowing with a posterior disc
osteophyte complex as well as uncovertebral and facet hypertrophy. A
C5-C6 posterior disc osteophyte complex is also present.

Upper chest: No consolidation within the imaged lung apices. No
visible pneumothorax.
IMPRESSION: CT head:

No evidence of acute intracranial abnormality.

CT maxillofacial:

1. No evidence of acute maxillofacial fracture.
2. Mild soft tissue swelling is questioned along the nasal bridge.
3. Mild ethmoid and left maxillary sinus mucosal thickening.

CT cervical spine:

1. No evidence of acute fracture to the cervical spine.
2. Cervical spondylosis which is greatest at C6-C7.

## 2020-11-12 IMAGING — CT CT CERVICAL SPINE W/O CM
3 of 4 series · 12 of 33 positions shown, 14 images · non-contrast
Comparison: Report from head CT 03/09/1999 (images unavailable).

CLINICAL DATA: Head trauma, headache. Neck trauma, uncomplicated.
Facial trauma. Patient reports fall, hitting nose.

EXAM:
CT HEAD WITHOUT CONTRAST
CT MAXILLOFACIAL WITHOUT CONTRAST
CT CERVICAL SPINE WITHOUT CONTRAST
TECHNIQUE: Multidetector CT imaging of the head, cervical spine, and
maxillofacial structures were performed using the standard protocol
without intravenous contrast. Multiplanar CT image reconstructions
of the cervical spine and maxillofacial structures were also
generated.

[Series 4: sagittal bone · sagittal · 0.30mm/px · 5 of 67 slices shown, 6 images]
[im 23/67  bone]
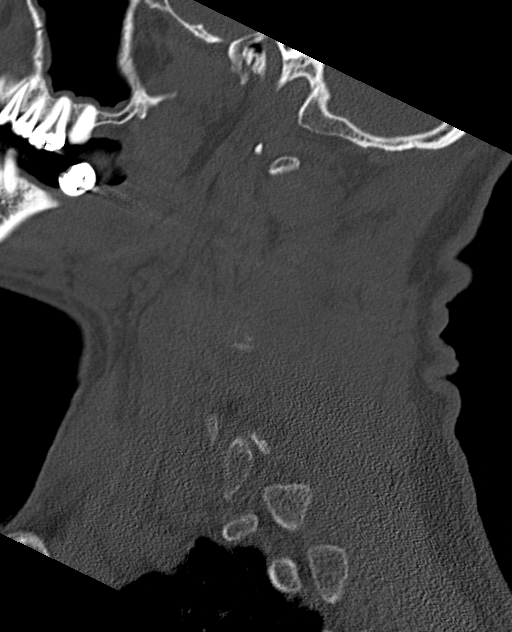
[im 28/67  bone]
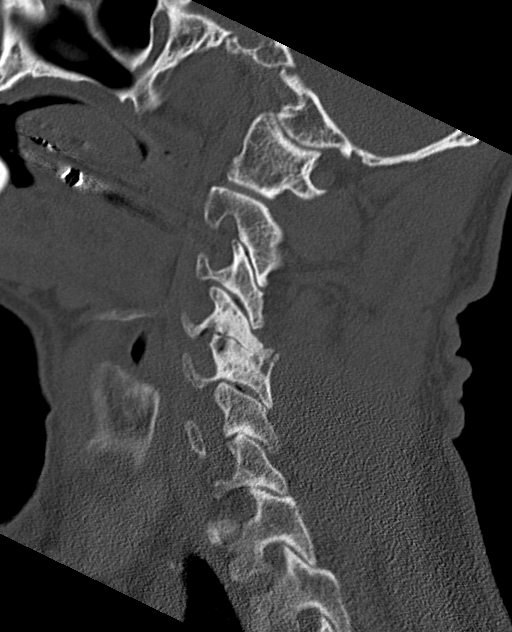
[im 34/67  soft-tissue]
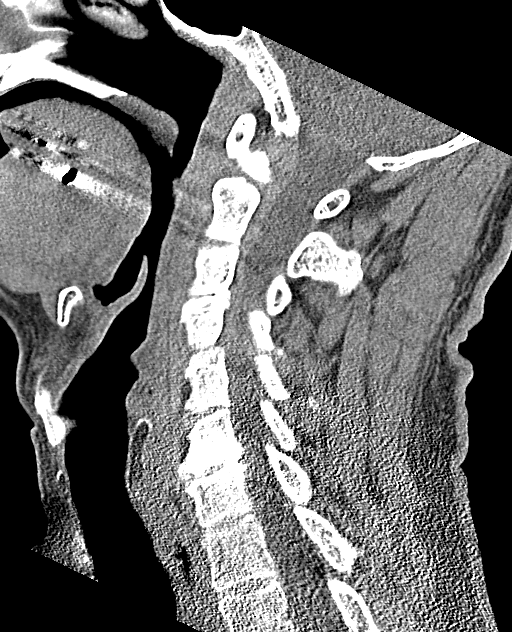
[im 34/67  bone]
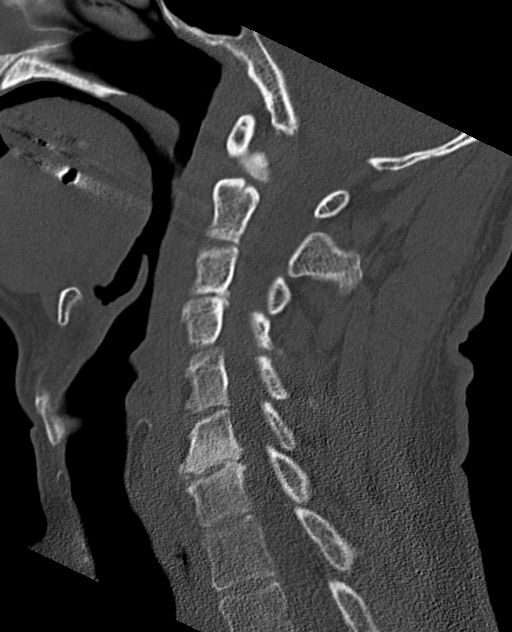
[im 39/67  bone]
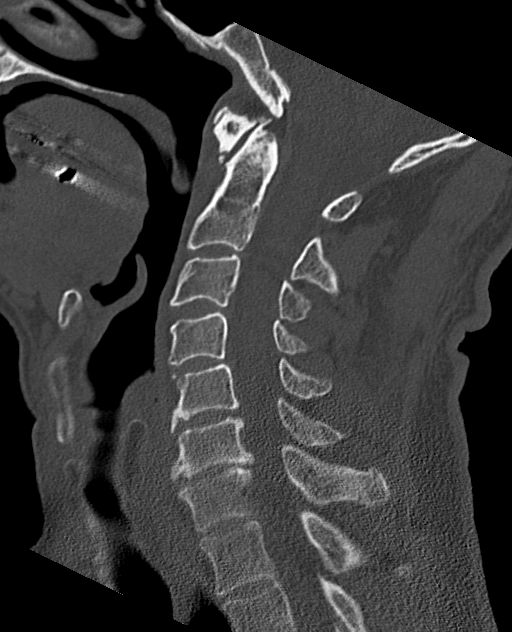
[im 45/67  bone]
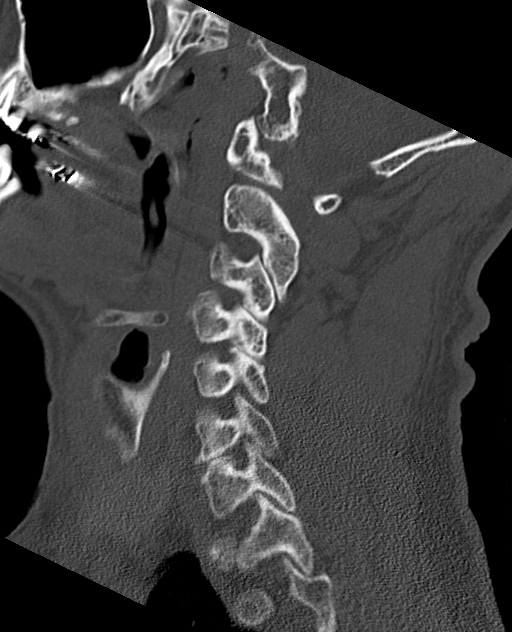

[Series 5: coronal bone · coronal · 0.26mm/px · 3 of 62 slices shown]
[im 14/62  bone]
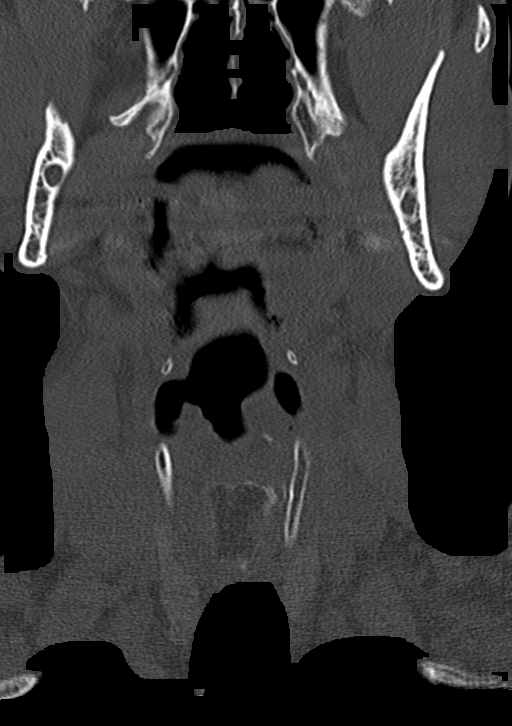
[im 25/62  bone]
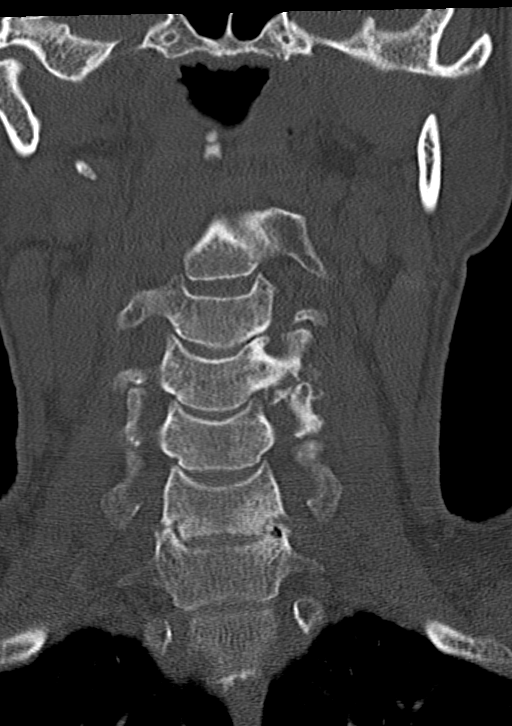
[im 37/62  bone]
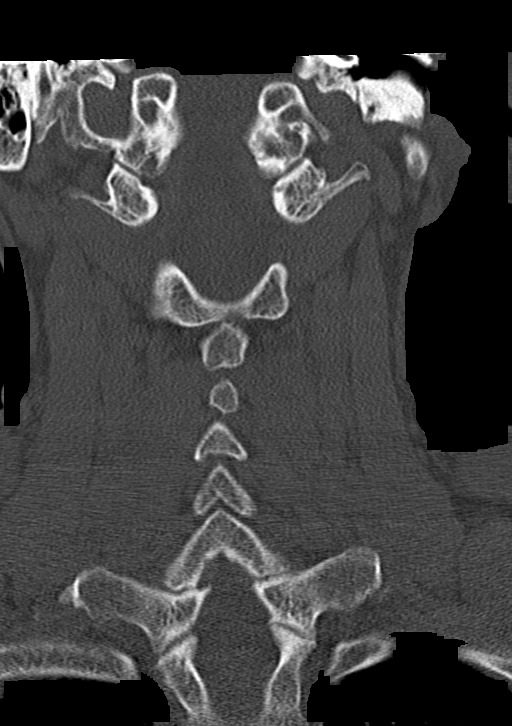

[Series 6: orthogonal axials · axial · 0.24mm/px · z∈[-292,-177]mm · 4 of 96 slices shown, 5 images]
[im 16/96  soft-tissue]
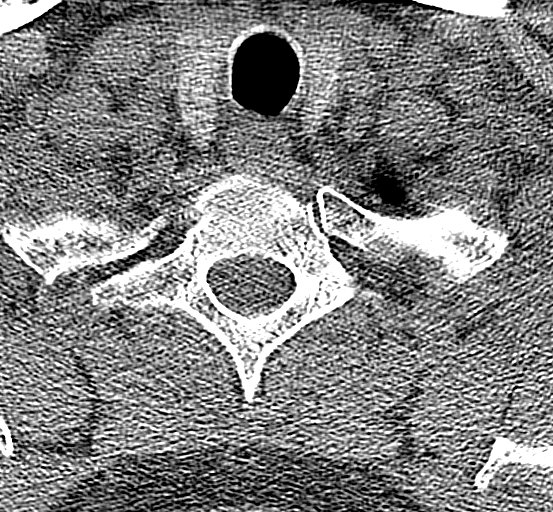
[im 16/96  bone]
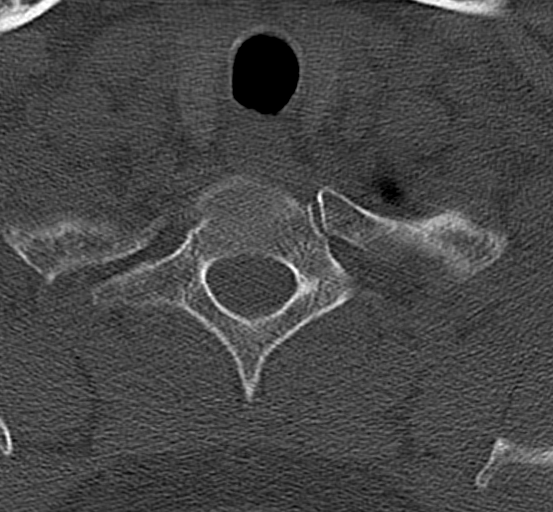
[im 32/96  bone]
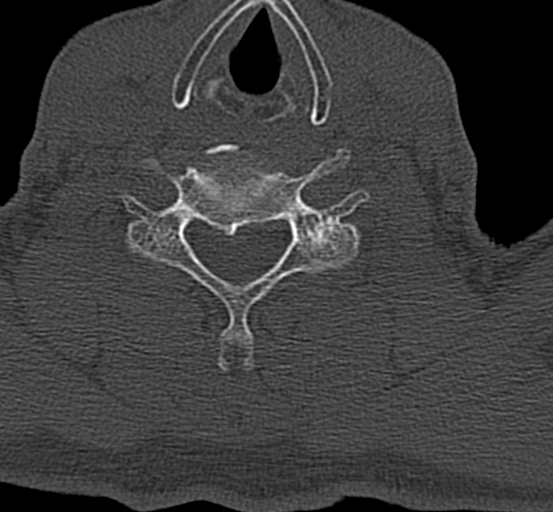
[im 64/96  bone]
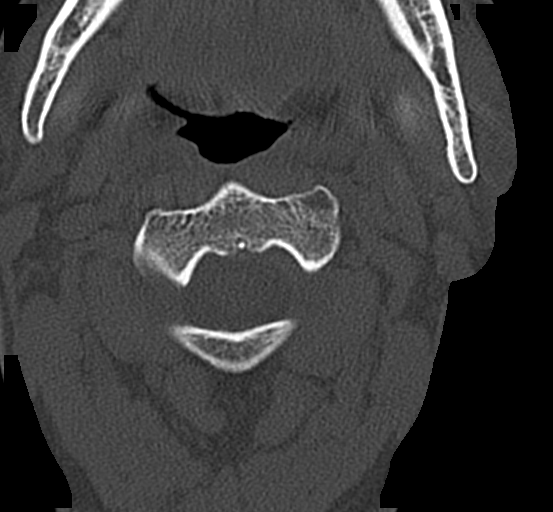
[im 80/96  bone]
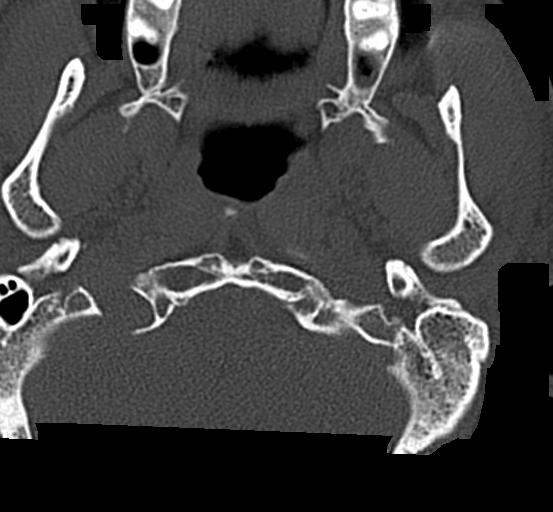

[12 of 33 positions shown; findings below may reference images not displayed]

FINDINGS: CT HEAD FINDINGS

Brain:

Mildly motion degraded examination.

There is no acute intracranial hemorrhage.

No demarcated cortical infarct.

No extra-axial fluid collection.

No evidence of intracranial mass.

No midline shift.

Vascular: No hyperdense vessel.

Skull: Normal. Negative for fracture or focal lesion.

CT MAXILLOFACIAL FINDINGS

Osseous: No acute maxillofacial fracture is demonstrated.

Orbits: The globes are normal in size and contour. The extraocular
muscles are symmetric and unremarkable.

Sinuses: Mild ethmoid and left maxillary sinus mucosal thickening.

Soft tissues: Mild soft tissue swelling is questioned along the
bridge of the nose.

Other: Poor dentition with multiple absent, carious and fractured
teeth as well as multifocal periapical lucencies.

CT CERVICAL SPINE FINDINGS

Alignment: No significant spondylolisthesis.

Skull base and vertebrae: The basion-dental and atlanto-dental
intervals are maintained.No evidence of acute fracture to the
cervical spine.

Soft tissues and spinal canal: No visible canal hematoma or
prevertebral soft tissue swelling.

Disc levels: Cervical spondylosis. Most notably at C6-C7 there is
moderate/advanced disc space narrowing with a posterior disc
osteophyte complex as well as uncovertebral and facet hypertrophy. A
C5-C6 posterior disc osteophyte complex is also present.

Upper chest: No consolidation within the imaged lung apices. No
visible pneumothorax.
IMPRESSION: CT head:

No evidence of acute intracranial abnormality.

CT maxillofacial:

1. No evidence of acute maxillofacial fracture.
2. Mild soft tissue swelling is questioned along the nasal bridge.
3. Mild ethmoid and left maxillary sinus mucosal thickening.

CT cervical spine:

1. No evidence of acute fracture to the cervical spine.
2. Cervical spondylosis which is greatest at C6-C7.

## 2021-03-24 ENCOUNTER — Other Ambulatory Visit: Payer: Self-pay

## 2021-05-11 ENCOUNTER — Other Ambulatory Visit: Payer: Self-pay

## 2021-05-11 ENCOUNTER — Emergency Department
Admission: EM | Admit: 2021-05-11 | Discharge: 2021-05-14 | Disposition: A | Discharge status: Other Acute Inpatient Hospital | Payer: Self-pay | Attending: Emergency Medicine | Admitting: Emergency Medicine

## 2021-05-11 DIAGNOSIS — Z20822 Contact with and (suspected) exposure to covid-19: Secondary | ICD-10-CM | POA: Insufficient documentation

## 2021-05-11 DIAGNOSIS — F15259 Other stimulant dependence with stimulant-induced psychotic disorder, unspecified: Secondary | ICD-10-CM | POA: Insufficient documentation

## 2021-05-11 DIAGNOSIS — F319 Bipolar disorder, unspecified: Secondary | ICD-10-CM | POA: Diagnosis present

## 2021-05-11 DIAGNOSIS — F15959 Other stimulant use, unspecified with stimulant-induced psychotic disorder, unspecified: Secondary | ICD-10-CM | POA: Diagnosis present

## 2021-05-11 DIAGNOSIS — F1520 Other stimulant dependence, uncomplicated: Secondary | ICD-10-CM | POA: Diagnosis present

## 2021-05-11 DIAGNOSIS — R45851 Suicidal ideations: Secondary | ICD-10-CM | POA: Insufficient documentation

## 2021-05-11 DIAGNOSIS — F17220 Nicotine dependence, chewing tobacco, uncomplicated: Secondary | ICD-10-CM | POA: Insufficient documentation

## 2021-05-11 DIAGNOSIS — F1510 Other stimulant abuse, uncomplicated: Secondary | ICD-10-CM

## 2021-05-11 DIAGNOSIS — F1994 Other psychoactive substance use, unspecified with psychoactive substance-induced mood disorder: Secondary | ICD-10-CM

## 2021-05-11 DIAGNOSIS — F1910 Other psychoactive substance abuse, uncomplicated: Secondary | ICD-10-CM | POA: Diagnosis present

## 2021-05-11 LAB — COMPREHENSIVE METABOLIC PANEL
ALT: 44 U/L (ref 0–44)
AST: 40 U/L (ref 15–41)
Albumin: 3.7 g/dL (ref 3.5–5.0)
Alkaline Phosphatase: 69 U/L (ref 38–126)
Anion gap: 7 (ref 5–15)
BUN: 18 mg/dL (ref 6–20)
CO2: 20 mmol/L — ABNORMAL LOW (ref 22–32)
Calcium: 8.3 mg/dL — ABNORMAL LOW (ref 8.9–10.3)
Chloride: 108 mmol/L (ref 98–111)
Creatinine, Ser: 0.63 mg/dL (ref 0.61–1.24)
GFR, Estimated: >60 mL/min (ref >60)
Glucose, Bld: 118 mg/dL — ABNORMAL HIGH (ref 70–99)
Potassium: 3.6 mmol/L (ref 3.5–5.1)
Sodium: 135 mmol/L (ref 135–145)
Total Bilirubin: 0.6 mg/dL (ref 0.3–1.2)
Total Protein: 6.9 g/dL (ref 6.5–8.1)

## 2021-05-11 LAB — URINE DRUG SCREEN, QUALITATIVE (ARMC ONLY)
Amphetamines, Ur Screen: POSITIVE — AB
Barbiturates, Ur Screen: NOT DETECTED
Benzodiazepine, Ur Scrn: NOT DETECTED
Cannabinoid 50 Ng, Ur ~~LOC~~: NOT DETECTED
Cocaine Metabolite,Ur ~~LOC~~: NOT DETECTED
MDMA (Ecstasy)Ur Screen: NOT DETECTED
Methadone Scn, Ur: NOT DETECTED
Opiate, Ur Screen: NOT DETECTED
Phencyclidine (PCP) Ur S: NOT DETECTED
Tricyclic, Ur Screen: NOT DETECTED

## 2021-05-11 LAB — CBC
HCT: 42.1 % (ref 39.0–52.0)
Hemoglobin: 14.6 g/dL (ref 13.0–17.0)
MCH: 31.4 pg (ref 26.0–34.0)
MCHC: 34.7 g/dL (ref 30.0–36.0)
MCV: 90.5 fL (ref 80.0–100.0)
Platelets: 141 10*3/uL — ABNORMAL LOW (ref 150–400)
RBC: 4.65 MIL/uL (ref 4.22–5.81)
RDW: 13.1 % (ref 11.5–15.5)
WBC: 6.9 10*3/uL (ref 4.0–10.5)
nRBC: 0 % (ref 0.0–0.2)

## 2021-05-11 LAB — ACETAMINOPHEN LEVEL: Acetaminophen (Tylenol), Serum: <10 ug/mL — ABNORMAL LOW (ref 10–30)

## 2021-05-11 LAB — ETHANOL: Alcohol, Ethyl (B): <10 mg/dL (ref <10)

## 2021-05-11 LAB — SALICYLATE LEVEL: Salicylate Lvl: <7 mg/dL — ABNORMAL LOW (ref 7.0–30.0)

## 2021-05-11 MED ORDER — HALOPERIDOL 5 MG PO TABS
5.0000 mg | ORAL_TABLET | Freq: Two times a day (BID) | ORAL | Status: DC
  Administered 2021-05-12 – 2021-05-14 (×5): 5 mg via ORAL
  Filled 2021-05-11 (×5): qty 1

## 2021-05-11 MED ORDER — HALOPERIDOL LACTATE 5 MG/ML IJ SOLN
5.0000 mg | Freq: Once | INTRAMUSCULAR | Status: AC
Start: 2021-05-11 — End: 2021-05-11
  Administered 2021-05-11: 5 mg via INTRAMUSCULAR
  Filled 2021-05-11: qty 1

## 2021-05-11 MED ORDER — DULOXETINE HCL 30 MG PO CPEP
30.0000 mg | ORAL_CAPSULE | Freq: Every day | ORAL | Status: DC
  Administered 2021-05-12 – 2021-05-14 (×3): 30 mg via ORAL
  Filled 2021-05-11 (×3): qty 1

## 2021-05-11 MED ORDER — LORAZEPAM 1 MG PO TABS
1.0000 mg | ORAL_TABLET | Freq: Once | ORAL | Status: AC
  Administered 2021-05-11: 1 mg via ORAL
  Filled 2021-05-11: qty 1

## 2021-05-11 MED ORDER — DIPHENHYDRAMINE HCL 50 MG/ML IJ SOLN
50.0000 mg | Freq: Once | INTRAMUSCULAR | Status: AC
  Administered 2021-05-11: 50 mg via INTRAMUSCULAR
  Filled 2021-05-11: qty 1

## 2021-05-11 NOTE — ED Triage Notes (Signed)
Pt reports that he called the police to bring him to the hospital, states that he lives with his brother and states that his room is "bugged" and states he got to thinking about it and wants to kill both his brothers, pt states that he knew he needed help for feeling that way, pt is moving a lot while talking to this RN and holding his head and neck in a hanging down position, when pt was called in the lobby pt was found at the vending machine purchasing a snack

## 2021-05-11 NOTE — ED Notes (Signed)
Pt took IM and PO medication voluntarily. Pt currently sitting in room with no signs of distress.

## 2021-05-11 NOTE — ED Provider Notes (Signed)
[facility] Provider Note    Event Date/Time   First MD Initiated Contact with Patient 05/11/21 1905     (approximate)   History   Homicidal   HPI  Nicholas Lindsey is a 56 y.o. male here with suicidal ideation.  The patient states that he is here because he has been off of his medications.  He states he feels like his brother is poisoning him and that he wants to kill him.  Per report, he has also been making statements about FBI after him.  He is markedly agitated on arrival, pacing the room, limiting further history.  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  He does admit to recent amphetamine use.     Physical Exam   Triage Vital Signs: ED Triage Vitals  Enc Vitals Group     BP 05/11/21 1815 112/88     Pulse Rate 05/11/21 1815 80     Resp 05/11/21 1815 20     Temp 05/11/21 1815 98.3 F (36.8 C)     Temp Source 05/11/21 1815 Oral     SpO2 05/11/21 1815 99 %     Weight 05/11/21 1816 180 lb (81.6 kg)     Height 05/11/21 1816 5\' 8"  (1.727 m)     Head Circumference --      Peak Flow --      Pain Score 05/11/21 1816 0     Pain Loc --      Pain Edu? --      Excl. in [place]? --     Most recent vital signs: Vitals:   05/11/21 1815  BP: 112/88  Pulse: 80  Resp: 20  Temp: 98.3 F (36.8 C)  SpO2: 99%     General: Awake, mild distress and agitation noted. CV:  Good peripheral perfusion.  No murmurs rubs Resp:  Normal effort.  Normal work of breathing. No murmurs, rubs, gallops. Abd:  No distention. No tenderness. Other:  Agitated, pacing room, slightly pressured speech.   ED Results / Procedures / Treatments   Labs (all labs ordered are listed, but only abnormal results are displayed) Labs Reviewed  COMPREHENSIVE METABOLIC PANEL - Abnormal; Notable for the following components:      Result Value   CO2 20 (*)    Glucose, Bld 118 (*)    Calcium 8.3 (*)    All other components within  normal limits  SALICYLATE LEVEL - Abnormal; Notable for the following components:   Salicylate Lvl <7.5 (*)    All other components within normal limits  ACETAMINOPHEN LEVEL - Abnormal; Notable for the following components:   Acetaminophen (Tylenol), Serum <10 (*)    All other components within normal limits  URINE DRUG SCREEN, QUALITATIVE ([facility] ONLY) - Abnormal; Notable for the following components:   Amphetamines, Ur Screen POSITIVE (*)    All other components within normal limits  CBC - Abnormal; Notable for the following components:   Platelets 141 (*)    All other components within normal limits  ETHANOL     EKG None   RADIOLOGY None    PROCEDURES:  Critical Care performed: No   MEDICATIONS ORDERED IN ED: Medications  haloperidol lactate (HALDOL) injection 5 mg (5 mg Intramuscular Given 05/11/21 1914)  diphenhydrAMINE (BENADRYL) injection 50 mg (50 mg Intramuscular Given 05/11/21 1914)  LORazepam (ATIVAN) tablet 1 mg (1 mg Oral Given 05/11/21 1914)     IMPRESSION / MDM / ASSESSMENT AND  PLAN / ED COURSE  I reviewed the triage vital signs and the nursing notes.                               MDM:  56 year old male here with likely substance-induced psychosis versus primary mania or schizoaffective disorder.  Patient markedly agitated on arrival, pacing, unable to calm him down with verbal redirection.  Patient subsequently required IM antipsychotics with good effect.  IVC placed.  Otherwise, lab work is overall unremarkable.  No significant leukocytosis or anemia on CBC.  CMP is unremarkable.  UDS positive for amphetamines, of which patient has a history based on my review of his previous ED visits and admissions.  Salicylate negative.  Tylenol negative.  Will consult TTS/psychiatry for further evaluation.   MEDICATIONS GIVEN IN ED: Medications  haloperidol lactate (HALDOL) injection 5 mg (5 mg Intramuscular Given 05/11/21 1914)  diphenhydrAMINE (BENADRYL) injection 50  mg (50 mg Intramuscular Given 05/11/21 1914)  LORazepam (ATIVAN) tablet 1 mg (1 mg Oral Given 05/11/21 1914)     Consults:  Psychiatry, case discussed and they will plan to reevaluate in the morning.   EMR reviewed  Previous ED visits as well as psychiatric notes, with mention of previous methamphetamine induced mood disorder.  Last admission was 12/2019 for COVID-19 at [facility].     FINAL CLINICAL IMPRESSION(S) / ED DIAGNOSES   Final diagnoses:  Substance induced mood disorder ([place])     Rx / DC Orders   ED Discharge Orders     None        Note:  This document was prepared using Dragon voice recognition software and may include unintentional dictation errors.   Duffy Bruce, MD 05/11/21 2230

## 2021-05-11 NOTE — ED Notes (Signed)
See triage note for additional details, pt to ED for HI BIB [facility]. Pt states his brothers are "out to get him" and are working with the [place]; pt also states his home is "bugged". Pt Homicidal to brothers and "the ones out to get me", denies SI.  Upon assessment pt yelling and pacing around room stating " I need a shot to help me because when I get like this I need a shot to help me calm down". Pt denies any current Auditory/ Visual hallucinations.

## 2021-05-11 NOTE — ED Notes (Signed)
Phone, wallet,can of dip, red long sleeve shirt, brown boots, jeans,brown jacket, white socks, brown belt, gray underwear

## 2021-05-12 DIAGNOSIS — F15959 Other stimulant use, unspecified with stimulant-induced psychotic disorder, unspecified: Secondary | ICD-10-CM

## 2021-05-12 NOTE — ED Notes (Signed)
Snack and drink given 

## 2021-05-12 NOTE — ED Notes (Signed)
Dietary called and states they will bring a dinner tray up shortly.

## 2021-05-12 NOTE — Consult Note (Signed)
[facility] Face-to-Face Psychiatry Consult   Reason for Consult: Homicidal Referring Physician: Dr. 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 Nicholas Lindsey is a 56 y.o. male patient presented to [facility] ED via law enforcement under involuntary commitment status (IVC). Per the ed triage nurse note, Pt reports that he called the police to bring him to the hospital, states that he lives with his brother and says that his room is "bugged," and states he got to thinking about it and wants to kill both his brothers, pt says that he knew he needed help for feeling that way, pt is moving a lot while talking to this RN and holding his head and neck in a hanging down position when pt was called in the lobby pt was found at the vending machine purchasing a snack.  The patient was seen face-to-face by this provider; the chart was reviewed and consulted with Dr.Isaacs on 05/11/2021 due to the patient's care. It was discussed with the EDP that the patient remained under observation overnight and will be reassessed in the a.m. to determine if he meets the criteria for psychiatric inpatient admission; he could be discharged home. On evaluation, the patient is alert and oriented x 2-3, calm, sleepiness due to being medicated but cooperative, and mood-congruent with affect. The patient does not appear to be responding to internal or external stimuli. Neither is the patient presenting with any delusional thinking. The patient denies auditory or visual hallucinations. The patient denies any suicidal, homicidal, or self-harm ideations. The  patient is not presenting with any psychotic or paranoid behaviors.   HPI: Per Dr. Ellender Hose,   Nicholas Lindsey is a 56 y.o. male here with suicidal ideation.  The patient states that he is here because he has been off of his medications.  He states he feels like his brother is poisoning him and that he wants to kill him.  Per report, he has also been making statements about FBI after him.  He is markedly agitated on arrival, pacing the room, limiting further history.  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  He does admit to recent amphetamine use.  Past Psychiatric History:   Risk to Self:   Risk to Others:   Prior Inpatient Therapy:   Prior Outpatient Therapy:    Past Medical History:  Past Medical History:  Diagnosis Date   History of kidney stones    Left breast abscess 2008   I&D required- [place] (per patient)   Nodule of anterior chest wall 01/19/2017    Past Surgical History:  Procedure Laterality Date   ABCESS DRAINAGE Left 2008   Breast- [place], [place]   Family History:  Family History  Problem Relation Age of Onset   Diabetes Mother    Hypertension Mother    Heart attack Father    Family Psychiatric  History:  Social History:  Social History   Substance and Sexual Activity  Alcohol Use Not Currently   Comment: 12 Beers / Weekly     Social History   Substance and Sexual Activity  Drug Use Yes  Types: Methamphetamines    Social History   Socioeconomic History   Marital status: Single    Spouse name: Not on file   Number of children: Not on file   Years of education: Not on file   Highest education level: Not on file  Occupational History   Not on file  Tobacco Use   Smoking status: Never   Smokeless tobacco: Current    Types: Chew   Tobacco comments:    1/2 Can chew Daily  Vaping Use   Vaping Use: Never used  Substance and Sexual Activity   Alcohol use: Not Currently    Comment: 12  Beers / Weekly   Drug use: Yes    Types: Methamphetamines   Sexual activity: Not on file  Other Topics Concern   Not on file  Social History Narrative   Not on file   Social Determinants of Health   Financial Resource Strain: Not on file  Food Insecurity: Not on file  Transportation Needs: Not on file  Physical Activity: Not on file  Stress: Not on file  Social Connections: Not on file   Additional Social History:    Allergies:  No Known Allergies  Labs:  Results for orders placed or performed during the hospital encounter of 05/11/21 (from the past 48 hour(s))  Comprehensive metabolic panel     Status: Abnormal   Collection Time: 05/11/21  6:18 PM  Result Value Ref Range   Sodium 135 135 - 145 mmol/L   Potassium 3.6 3.5 - 5.1 mmol/L   Chloride 108 98 - 111 mmol/L   CO2 20 (L) 22 - 32 mmol/L   Glucose, Bld 118 (H) 70 - 99 mg/dL    Comment: Glucose reference range applies only to samples taken after fasting for at least 8 hours.   BUN 18 6 - 20 mg/dL   Creatinine, Ser 0.63 0.61 - 1.24 mg/dL   Calcium 8.3 (L) 8.9 - 10.3 mg/dL   Total Protein 6.9 6.5 - 8.1 g/dL   Albumin 3.7 3.5 - 5.0 g/dL   AST 40 15 - 41 U/L   ALT 44 0 - 44 U/L   Alkaline Phosphatase 69 38 - 126 U/L   Total Bilirubin 0.6 0.3 - 1.2 mg/dL   GFR, Estimated >60 >60 mL/min    Comment: (NOTE) Calculated using the CKD-EPI Creatinine Equation (2021)    Anion gap 7 5 - 15    Comment: Performed at [facility], [address] [place], [place] [address]  Ethanol     Status: None   Collection Time: 05/11/21  6:18 PM  Result Value Ref Range   Alcohol, Ethyl (B) <10 <10 mg/dL    Comment: (NOTE) Lowest detectable limit for serum alcohol is 10 mg/dL.  For medical purposes only. Performed at [facility], [address] [place], [place] [address]   Salicylate level     Status: Abnormal   Collection Time: 05/11/21  6:18 PM  Result Value Ref Range   Salicylate Lvl <7.1 (L) 7.0 - 30.0  mg/dL    Comment: Performed at [facility], [address] [place], [place] [address]  Acetaminophen level     Status: Abnormal   Collection Time: 05/11/21  6:18 PM  Result Value Ref Range   Acetaminophen (Tylenol), Serum <10 (L) 10 - 30 ug/mL    Comment: (NOTE) Therapeutic concentrations vary significantly. A range of 10-30 ug/mL  may be an effective concentration for many patients. However, some  are best treated at concentrations outside of this range. Acetaminophen concentrations >150 ug/mL at 4 hours after ingestion  and >50 ug/mL at 12 hours after ingestion are often associated with  toxic reactions.  Performed at [facility], [address] [place], [place] [address]   Urine Drug Screen, Qualitative     Status: Abnormal   Collection Time: 05/11/21  7:20 PM  Result Value Ref Range   Tricyclic, Ur Screen NONE DETECTED NONE DETECTED   Amphetamines, Ur Screen POSITIVE (A) NONE DETECTED   MDMA (Ecstasy)Ur Screen NONE DETECTED NONE DETECTED   Cocaine Metabolite,Ur [place] NONE DETECTED NONE DETECTED   Opiate, Ur Screen NONE DETECTED NONE DETECTED   Phencyclidine (PCP) Ur S NONE DETECTED NONE DETECTED   Cannabinoid 50 Ng, Ur [place] NONE DETECTED NONE DETECTED   Barbiturates, Ur Screen NONE DETECTED NONE DETECTED   Benzodiazepine, Ur Scrn NONE DETECTED NONE DETECTED   Methadone Scn, Ur NONE DETECTED NONE DETECTED    Comment: (NOTE) Tricyclics + metabolites, urine    Cutoff 1000 ng/mL Amphetamines + metabolites, urine  Cutoff 1000 ng/mL MDMA (Ecstasy), urine              Cutoff 500 ng/mL Cocaine Metabolite, urine          Cutoff 300 ng/mL Opiate + metabolites, urine        Cutoff 300 ng/mL Phencyclidine (PCP), urine         Cutoff 25 ng/mL Cannabinoid, urine                 Cutoff 50 ng/mL Barbiturates + metabolites, urine  Cutoff 200 ng/mL Benzodiazepine, urine              Cutoff 200 ng/mL Methadone, urine                   Cutoff 300 ng/mL  The urine drug  screen provides only a preliminary, unconfirmed analytical test result and should not be used for non-medical purposes. Clinical consideration and professional judgment should be applied to any positive drug screen result due to possible interfering substances. A more specific alternate chemical method must be used in order to obtain a confirmed analytical result. Gas chromatography / mass spectrometry (GC/MS) is the preferred confirm atory method. Performed at [facility], [address] [place], [place] [address]   CBC     Status: Abnormal   Collection Time: 05/11/21  8:00 PM  Result Value Ref Range   WBC 6.9 4.0 - 10.5 K/uL   RBC 4.65 4.22 - 5.81 MIL/uL   Hemoglobin 14.6 13.0 - 17.0 g/dL   HCT 42.1 39.0 - 52.0 %   MCV 90.5 80.0 - 100.0 fL   MCH 31.4 26.0 - 34.0 pg   MCHC 34.7 30.0 - 36.0 g/dL   RDW 13.1 11.5 - 15.5 %   Platelets 141 (L) 150 - 400 K/uL   nRBC 0.0 0.0 - 0.2 %    Comment: Performed at [facility], [address] [place],  [address]    Current Facility-Administered Medications  Medication Dose Route Frequency Provider Last Rate Last Admin   DULoxetine (CYMBALTA) DR capsule 30 mg  30 mg Oral Daily Duffy Bruce, MD       haloperidol (HALDOL) tablet 5 mg  5 mg Oral BID Duffy Bruce, MD       Current Outpatient Medications  Medication Sig Dispense Refill   DULoxetine (CYMBALTA) 30 MG capsule Take 1 capsule (30 mg total) by  mouth daily. 30 capsule 0   haloperidol (HALDOL) 5 MG tablet Take 1 tablet (5 mg total) by mouth 2 (two) times daily. 60 tablet 0    Musculoskeletal: Strength & Muscle Tone: within normal limits Gait & Station: normal Patient leans: N/A  Psychiatric Specialty Exam:  Presentation  General Appearance: Appropriate for Environment  Eye Contact:Minimal  Speech:Blocked; Garbled; Slurred  Speech Volume:Decreased  Handedness:Right   Mood and Affect  Mood:Labile  Affect:Congruent   Thought  Process  Thought Processes:Linear  Descriptions of Associations:Circumstantial  Orientation:Partial  Thought Content:Paranoid Ideation; Scattered  History of Schizophrenia/Schizoaffective disorder:No  Duration of Psychotic Symptoms:No data recorded Hallucinations:Hallucinations: None  Ideas of Reference:None  Suicidal Thoughts:Suicidal Thoughts: No  Homicidal Thoughts:Homicidal Thoughts: No   Sensorium  Memory:Immediate Poor; Recent Poor; Remote Poor  Judgment:Poor  Insight:Poor   Executive Functions  Concentration:Poor  Attention Span:Poor  Recall:Poor  Fund of Knowledge:Poor  Language:Poor   Psychomotor Activity  Psychomotor Activity:Psychomotor Activity: Normal   Assets  Assets:Communication Skills; Resilience; Social Support; Financial Resources/Insurance   Sleep  Sleep:Sleep: Poor   Physical Exam: Physical Exam Vitals and nursing note reviewed.  Constitutional:      Appearance: Normal appearance. He is normal weight.  HENT:     Head: Normocephalic and atraumatic.     Nose: Nose normal.  Cardiovascular:     Rate and Rhythm: Normal rate.     Pulses: Normal pulses.  Pulmonary:     Effort: Pulmonary effort is normal.  Musculoskeletal:        General: Normal range of motion.     Cervical back: Normal range of motion and neck supple.  Neurological:     General: No focal deficit present.     Mental Status: He is alert and oriented to person, place, and time.   ROS Blood pressure 112/88, pulse 80, temperature 98.3 F (36.8 C), temperature source Oral, resp. rate 20, height 5\' 8"  (1.727 m), weight 81.6 kg, SpO2 99 %. Body mass index is 27.37 kg/m.  Treatment Plan Summary: Daily contact with patient to assess and evaluate symptoms and progress in treatment and Plan The patient remained under observation overnight and will be reassessed in the a.m. to determine if he meets the criteria for psychiatric inpatient admission; he could be discharged  home.  Disposition: Supportive therapy provided about ongoing stressors. The patient remained under observation overnight and will be reassessed in the a.m. to determine if he meets the criteria for psychiatric inpatient admission; he could be discharged home.  [name], NP 05/12/2021 12:32 AM

## 2021-05-12 NOTE — ED Notes (Signed)
Pt given dinner  

## 2021-05-12 NOTE — ED Notes (Signed)
Patient sleeping at this time. NAD noted.

## 2021-05-12 NOTE — ED Notes (Signed)
Pt resting comfortable upon entering room, pt calm and cooperative for medication administration, denies pain, snack, beverage and toileting offered, pt declines at this time

## 2021-05-12 NOTE — ED Provider Notes (Signed)
Emergency Medicine Observation Re-evaluation Note  Nicholas Lindsey is a 56 y.o. male, seen on rounds today.  Pt initially presented to the ED for complaints of Homicidal Currently, the patient is resting, voices no medical complaints.  Physical Exam  BP 112/88 (BP Location: Left Arm)    Pulse 80    Temp 98.3 F (36.8 C) (Oral)    Resp 20    Ht 5\' 8"  (1.727 m)    Wt 81.6 kg    SpO2 99%    BMI 27.37 kg/m  Physical Exam General: Resting in no acute distress Cardiac: No cyanosis Lungs: Equal rise and fall Psych: Not agitated  ED Course / MDM  EKG:   I have reviewed the labs performed to date as well as medications administered while in observation.  Recent changes in the last 24 hours include no events overnight.  Plan  Current plan is for psychiatric disposition. Nicholas Lindsey is under involuntary commitment.      Paulette Blanch, MD 05/12/21 [phone]

## 2021-05-12 NOTE — Consult Note (Addendum)
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 male patient admitted with drug induced psychosis.  HPI: See prior assessment.  Patient seen and chart reviewed. Patient states that he knows his brother is "trying to mess with him", along with the FBI. He is still quite somnolent. He does admit to HI against brother. Endorses SI. UDS is positive for amphetamines. History of drug-induced psychosis.     Writer called brother, Nicholas Lindsey, who patient is currently staying with [phone]). Brother states that he thinks patient may have been ingesting some "illicit drugs" but he has also threatened to harm him and is "psychotic", saying things about the FBI, threatening to harm brother and others. Brother states "He needs some mental health help." Brother would like to be called when patient is transferred to another hospital.   Past Psychiatric History: Bipolar disorder; IV drug use; drug-induced psychosis  Risk to Self:   Risk to Others:   Prior Inpatient Therapy:   Prior Outpatient Therapy:    Past Medical History:  Past Medical History:  Diagnosis Date   History of kidney stones    Left breast abscess 2008   I&D required- [place], [place] (per patient)   Nodule of anterior chest wall 01/19/2017    Past Surgical History:  Procedure Laterality Date   ABCESS DRAINAGE Left 2008   Breast- [place], [place]   Family History:  Family History  Problem Relation Age of Onset   Diabetes Mother     Hypertension Mother    Heart attack Father    Family Psychiatric  History: unknown Social History:  Social History   Substance and Sexual Activity  Alcohol Use Not Currently   Comment: 12 Beers / Weekly     Social History   Substance and Sexual Activity  Drug Use Yes   Types: Methamphetamines    Social History   Socioeconomic History   Marital status: Single    Spouse name: Not on file   Number of children: Not on file   Years of education: Not on file   Highest education level: Not on file  Occupational History   Not on file  Tobacco Use   Smoking status: Never   Smokeless tobacco: Current    Types: Chew   Tobacco comments:    1/2 Can chew Daily  Vaping Use   Vaping Use: Never used  Substance and Sexual Activity   Alcohol use: Not Currently    Comment: 12 Beers / Weekly   Drug use: Yes    Types: Methamphetamines   Sexual activity: Not on file  Other Topics Concern   Not on file  Social History Narrative   Not on file   Social Determinants of Health   Financial Resource Strain: Not on file  Food Insecurity: Not on file  Transportation Needs: Not on file  Physical Activity: Not on file  Stress: Not on file  Social Connections: Not on file   Additional Social History:    Allergies:  No Known Allergies  Labs:  Results for orders placed or  performed during the hospital encounter of 05/11/21 (from the past 48 hour(s))  Comprehensive metabolic panel     Status: Abnormal   Collection Time: 05/11/21  6:18 PM  Result Value Ref Range   Sodium 135 135 - 145 mmol/L   Potassium 3.6 3.5 - 5.1 mmol/L   Chloride 108 98 - 111 mmol/L   CO2 20 (L) 22 - 32 mmol/L   Glucose, Bld 118 (H) 70 - 99 mg/dL    Comment: Glucose reference range applies only to samples taken after fasting for at least 8 hours.   BUN 18 6 - 20 mg/dL   Creatinine, Ser 0.63 0.61 - 1.24 mg/dL   Calcium 8.3 (L) 8.9 - 10.3 mg/dL   Total Protein 6.9 6.5 - 8.1 g/dL   Albumin 3.7 3.5 - 5.0 g/dL    AST 40 15 - 41 U/L   ALT 44 0 - 44 U/L   Alkaline Phosphatase 69 38 - 126 U/L   Total Bilirubin 0.6 0.3 - 1.2 mg/dL   GFR, Estimated >60 >60 mL/min    Comment: (NOTE) Calculated using the CKD-EPI Creatinine Equation (2021)    Anion gap 7 5 - 15    Comment: Performed at [facility], [address] [place], [place] [address]  Ethanol     Status: None   Collection Time: 05/11/21  6:18 PM  Result Value Ref Range   Alcohol, Ethyl (B) <10 <10 mg/dL    Comment: (NOTE) Lowest detectable limit for serum alcohol is 10 mg/dL.  For medical purposes only. Performed at [facility], [address] [place], [place] [address]   Salicylate level     Status: Abnormal   Collection Time: 05/11/21  6:18 PM  Result Value Ref Range   Salicylate Lvl <6.6 (L) 7.0 - 30.0 mg/dL    Comment: Performed at [facility], [address] [place], [place] [address]  Acetaminophen level     Status: Abnormal   Collection Time: 05/11/21  6:18 PM  Result Value Ref Range   Acetaminophen (Tylenol), Serum <10 (L) 10 - 30 ug/mL    Comment: (NOTE) Therapeutic concentrations vary significantly. A range of 10-30 ug/mL  may be an effective concentration for many patients. However, some  are best treated at concentrations outside of this range. Acetaminophen concentrations >150 ug/mL at 4 hours after ingestion  and >50 ug/mL at 12 hours after ingestion are often associated with  toxic reactions.  Performed at [facility], [address] [place], [place] [address]   Urine Drug Screen, Qualitative     Status: Abnormal   Collection Time: 05/11/21  7:20 PM  Result Value Ref Range   Tricyclic, Ur Screen NONE DETECTED NONE DETECTED   Amphetamines, Ur Screen POSITIVE (A) NONE DETECTED   MDMA (Ecstasy)Ur Screen NONE DETECTED NONE DETECTED   Cocaine Metabolite,Ur [place] NONE DETECTED NONE DETECTED   Opiate, Ur Screen NONE DETECTED NONE DETECTED   Phencyclidine (PCP) Ur S NONE  DETECTED NONE DETECTED   Cannabinoid 50 Ng, Ur [place] NONE DETECTED NONE DETECTED   Barbiturates, Ur Screen NONE DETECTED NONE DETECTED   Benzodiazepine, Ur Scrn NONE DETECTED NONE DETECTED   Methadone Scn, Ur NONE DETECTED NONE DETECTED    Comment: (NOTE) Tricyclics + metabolites, urine    Cutoff 1000 ng/mL Amphetamines + metabolites, urine  Cutoff 1000 ng/mL MDMA (Ecstasy), urine              Cutoff 500 ng/mL Cocaine Metabolite, urine  Cutoff 300 ng/mL Opiate + metabolites, urine        Cutoff 300 ng/mL Phencyclidine (PCP), urine         Cutoff 25 ng/mL Cannabinoid, urine                 Cutoff 50 ng/mL Barbiturates + metabolites, urine  Cutoff 200 ng/mL Benzodiazepine, urine              Cutoff 200 ng/mL Methadone, urine                   Cutoff 300 ng/mL  The urine drug screen provides only a preliminary, unconfirmed analytical test result and should not be used for non-medical purposes. Clinical consideration and professional judgment should be applied to any positive drug screen result due to possible interfering substances. A more specific alternate chemical method must be used in order to obtain a confirmed analytical result. Gas chromatography / mass spectrometry (GC/MS) is the preferred confirm atory method. Performed at [facility], [address] [place], [place] [address]   CBC     Status: Abnormal   Collection Time: 05/11/21  8:00 PM  Result Value Ref Range   WBC 6.9 4.0 - 10.5 K/uL   RBC 4.65 4.22 - 5.81 MIL/uL   Hemoglobin 14.6 13.0 - 17.0 g/dL   HCT 42.1 39.0 - 52.0 %   MCV 90.5 80.0 - 100.0 fL   MCH 31.4 26.0 - 34.0 pg   MCHC 34.7 30.0 - 36.0 g/dL   RDW 13.1 11.5 - 15.5 %   Platelets 141 (L) 150 - 400 K/uL   nRBC 0.0 0.0 - 0.2 %    Comment: Performed at [facility], [address] [place], [place] [address]    Current Facility-Administered Medications  Medication Dose Route Frequency Provider Last Rate Last Admin   DULoxetine  (CYMBALTA) DR capsule 30 mg  30 mg Oral Daily Duffy Bruce, MD       haloperidol (HALDOL) tablet 5 mg  5 mg Oral BID Duffy Bruce, MD       Current Outpatient Medications  Medication Sig Dispense Refill   DULoxetine (CYMBALTA) 30 MG capsule Take 1 capsule (30 mg total) by mouth daily. 30 capsule 0   haloperidol (HALDOL) 5 MG tablet Take 1 tablet (5 mg total) by mouth 2 (two) times daily. 60 tablet 0    Musculoskeletal: Strength & Muscle Tone: within normal limits Gait & Station:  not observed Patient leans: N/A   Psychiatric Specialty Exam:  Presentation  General Appearance: Appropriate for Environment  Eye Contact:Minimal  Speech:Blocked; Garbled; Slurred  Speech Volume:Decreased  Handedness:Right   Mood and Affect  Mood:Labile  Affect:Congruent   Thought Process  Thought Processes:Linear  Descriptions of Associations:Circumstantial  Orientation:Partial  Thought Content:Paranoid Ideation; Scattered  History of Schizophrenia/Schizoaffective disorder:No  Duration of Psychotic Symptoms:Greater than six months  Hallucinations:Hallucinations: None  Ideas of Reference:None  Suicidal Thoughts:Suicidal Thoughts: No  Homicidal Thoughts:Homicidal Thoughts: No   Sensorium  Memory:Immediate Poor; Recent Poor; Remote Poor  Judgment:Poor  Insight:Poor   Executive Functions  Concentration:Poor  Attention Span:Poor  Recall:Poor  Fund of Knowledge:Poor  Language:Poor   Psychomotor Activity  Psychomotor Activity:Psychomotor Activity: Normal   Assets  Assets:Communication Skills; Resilience; Social Support; Financial Resources/Insurance   Sleep  Sleep:Sleep: Poor   Physical Exam: Physical Exam ROS Blood pressure 112/88, pulse 80, temperature 98.3 F (36.8 C), temperature source Oral, resp. rate 20, height 5\' 8"  (1.727 m), weight 81.6 kg, SpO2 99 %.  Body mass index is 27.37 kg/m.  Treatment Plan Summary: Daily contact with patient to  assess and evaluate symptoms and progress in treatment, Medication management, and Plan refer out for inpatient psychiatric hospitalization . Reviewed with EDP and TTS  Disposition: Recommend psychiatric Inpatient admission when medically cleared.  Sherlon Handing, NP 05/12/2021 8:22 AM

## 2021-05-12 NOTE — ED Notes (Signed)
Pt belongings placed in [place] locker room

## 2021-05-12 NOTE — BH Assessment (Signed)
Comprehensive Clinical Assessment (CCA) Note  05/12/2021 [name] [id number]  Chief Complaint:  Chief Complaint  Patient presents with   Homicidal  Recommendations for Services/Supports/Treatments: Consulted with Lynder Parents., NP, who recommended pt be observed overnight and reassessment in the AM. Notified Dr. Ellender Hose and Minette Brine, RN of disposition recommendation.    Youlanda Roys. Hur is a 56 year old patient who presented to [facility] ED, involuntarily. Per chart review, pt presents with thoughts of HI towards his brothers. Upon assessment, pt was drowsy and disoriented due to having received IM medications. It was noted that the pt. had no insight and impaired judgement. When asked why he'd been brought to the hospital pt grunted and mumbled. Pt was cooperative, but passive throughout the assessment, refusing to answer many assessment questions. Pt has a chronic hx of methamphetamine abuse. Pt's UDS was positive for meth. Pt presented with a disheveled appearance. Pt's protective factors are being able to ask for help. Pt's speech slurred. Pt presented with an appropriate mood and affect to the situation. Pt denied current SI/HI/AV/H.   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 Self  Referral name: No data recorded Referral phone number: No data recorded  Whom do you see for routine medical problems? No data recorded Practice/Facility Name: No data recorded Practice/Facility Phone Number: No data recorded Name of Contact: No data recorded Contact Number: No data recorded Contact Fax Number: No data recorded Prescriber Name: No data recorded Prescriber Address (if known): No data recorded  What Is the Reason for Your Visit/Call  Today? Pt presented to [facility] ED via law enforcement under involuntary commitment status (IVC). Per the ed triage nurse note, Pt reports that he called the police to bring him to the hospital, states that he lives with his brother and says that his room is "bugged," and states he got to thinking about it and wants to kill both his brothers, pt says that he knew he needed help for feeling that way  How Long Has This Been Causing You Problems? > than 6 months  What Do You Feel Would Help You the Most Today? Alcohol or Drug Use Treatment   Have You Recently Been in Any Inpatient Treatment (Hospital/Detox/Crisis Center/28-Day Program)? No data recorded Name/Location of Program/Hospital:No data recorded How Long Were You There? No data recorded When Were You Discharged? No data recorded  Have You Ever Received Services From [facility] Before? No data recorded Who Do You See at [facility]? No data recorded  Have You Recently Had Any Thoughts About Hurting Yourself? Yes  Are You Planning to Commit Suicide/Harm Yourself At This time? No   Have you Recently Had Thoughts About Export? Yes  Explanation: No data recorded  Have You Used Any Alcohol or Drugs in the Past 24 Hours? Yes  How Long Ago Did You Use Drugs or Alcohol? No data recorded What Did You Use and How Much? Pt reported using methamphetamine   Do You Currently Have a Therapist/Psychiatrist? No  Name of Therapist/Psychiatrist: No data recorded  Have You Been Recently Discharged From Any Office Practice or Programs? No  Explanation of Discharge From Practice/Program: No data recorded    CCA Screening Triage Referral Assessment Type of Contact: Face-to-Face  Is this Initial or Reassessment? No data recorded Date Telepsych consult ordered in [facility]:  No data recorded Time Telepsych consult ordered in CHL:  No data recorded  Patient Reported Information Reviewed? No data recorded Patient Left Without Being Seen?  No data recorded Reason for Not Completing Assessment: No data recorded  Collateral Involvement: None provided   Does Patient Have a [place]? No data recorded Name and Contact of Legal Guardian: No data recorded If Minor and Not Living with Parent(s), Who has Custody? n/a  Is CPS involved or ever been involved? Never  Is APS involved or ever been involved? Never   Patient Determined To Be At Risk for Harm To Self or Others Based on Review of Patient Reported Information or Presenting Complaint? No  Method: No data recorded Availability of Means: No data recorded Intent: No data recorded Notification Required: No data recorded Additional Information for Danger to Others Potential: No data recorded Additional Comments for Danger to Others Potential: No data recorded Are There Guns or Other Weapons in Your Home? No data recorded Types of Guns/Weapons: No data recorded Are These Weapons Safely Secured?                            No data recorded Who Could Verify You Are Able To Have These Secured: No data recorded Do You Have any Outstanding Charges, Pending Court Dates, Parole/Probation? No data recorded Contacted To Inform of Risk of Harm To Self or Others: No data recorded  Location of Assessment: [facility] ED   Does Patient Present under Involuntary Commitment? Yes  IVC Papers Initial File Date: 05/11/21   [place] of Residence: [place]   Patient Currently Receiving the Following Services: Not Receiving Services   Determination of Need: Urgent (48 hours)   Options For Referral: Therapeutic Triage Services; ED Referral     CCA Biopsychosocial Intake/Chief Complaint:  No data recorded Current Symptoms/Problems: No data recorded  Patient Reported Schizophrenia/Schizoaffective Diagnosis in Past: No   Strengths: Pt is able to ask for help  Preferences: No data recorded Abilities: No data recorded  Type of Services Patient Feels are Needed:  No data recorded  Initial Clinical Notes/Concerns: No data recorded  Mental Health Symptoms Depression:   None   Duration of Depressive symptoms: No data recorded  Mania:   None   Anxiety:    None   Psychosis:   Grossly disorganized or catatonic behavior   Duration of Psychotic symptoms:  Greater than six months   Trauma:   N/A   Obsessions:   None   Compulsions:   "Driven" to perform behaviors/acts; Repeated behaviors/mental acts; Intrusive/time consuming; Disrupts with routine/functioning   Inattention:   None   Hyperactivity/Impulsivity:   Feeling of restlessness   Oppositional/Defiant Behaviors:   None   Emotional Irregularity:   Potentially harmful impulsivity; Recurrent suicidal behaviors/gestures/threats   Other Mood/Personality Symptoms:  No data recorded   Mental Status Exam Appearance and self-care  Stature:   Average   Weight:   Average weight   Clothing:   Disheveled   Grooming:   Neglected   Cosmetic use:   None   Posture/gait:   Normal   Motor activity:   Agitated   Sensorium  Attention:   Distractible   Concentration:   Variable   Orientation:   Object; Person; Place   Recall/memory:   Normal   Affect and Mood  Affect:   Appropriate  Mood:   Dysphoric   Relating  Eye contact:   None   Facial expression:   Responsive   Attitude toward examiner:   Passive   Thought and Language  Speech flow:  Slurred   Thought content:   Appropriate to Mood and Circumstances   Preoccupation:   None   Hallucinations:   None   Organization:  No data recorded  [employer] of Knowledge:   Average   Intelligence:   Average   Abstraction:   Overly abstract   Judgement:   Impaired   Reality Testing:   Variable   Insight:   Present   Decision Making:   Impulsive   Social Functioning  Social Maturity:   Self-centered   Social Judgement:   Heedless   Stress  Stressors:    Other (Comment) (substance abuse)   Coping Ability:   Exhausted   Skill Deficits:   Decision making; Self-care   Supports:   Support needed     Religion: Religion/Spirituality Are You A Religious Person?: No  Leisure/Recreation: Leisure / Recreation Do You Have Hobbies?: No  Exercise/Diet: Exercise/Diet Do You Exercise?: No Have You Gained or Lost A Significant Amount of Weight in the Past Six Months?: No Do You Follow a Special Diet?: No Do You Have Any Trouble Sleeping?: Yes Explanation of Sleeping Difficulties: Pt has issues with sleep due to meth abuse.   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)  Education: Education Is Patient Currently Attending School?: No Last Grade Completed:  (UTA) Did You Attend College?:  (UTA) Did You Have An Individualized Education Program (IIEP):  (UTA) Did You Have Any Difficulty At School?:  (UTA) Patient's Education Has Been Impacted by Current Illness:  (UTA)   CCA Family/Childhood History Family and Relationship History: Family history Marital status: Single Does patient have children?:  (UTA)  Childhood History:  Childhood History By whom was/is the patient raised?:  (UTA) Did patient suffer any verbal/emotional/physical/sexual abuse as a child?:  (UTA) Did patient suffer from severe childhood neglect?:  (UTA) Has patient ever been sexually abused/assaulted/raped as an adolescent or adult?:  (UTA) Was the patient ever a victim of a crime or a disaster?:  (UTA) Witnessed domestic violence?:  (UTA) Has patient been affected by domestic violence as an adult?:  Special educational needs teacher)  Child/Adolescent Assessment:     CCA Substance Use Alcohol/Drug Use: Alcohol / Drug Use Pain Medications: See MAR Prescriptions: See MAR Over the Counter: See MAR History  of alcohol / drug use?: Yes Longest period of sobriety (when/how long): UTA Negative Consequences of Use: Personal relationships Withdrawal Symptoms: Agitation                         ASAM's:  Six Dimensions of Multidimensional Assessment  Dimension 1:  Acute Intoxication and/or Withdrawal Potential:   Dimension 1:  Description of individual's past and current experiences of substance use and withdrawal: Pt has a chronic hx of meth abuse  Dimension 2:  Biomedical Conditions and Complications:      Dimension 3:  Emotional, Behavioral, or Cognitive Conditions and Complications:  Dimension 3:  Description of emotional, behavioral, or cognitive conditions and complications: Pt has a dx of bipolar disorder  Dimension 4:  Readiness to Change:     Dimension 5:  Relapse, Continued use, or Continued Problem Potential:  Dimension 6:  Recovery/Living Environment:     ASAM Severity Score: ASAM's Severity Rating Score: 13  ASAM Recommended Level of Treatment: ASAM Recommended Level of Treatment: Level III Residential Treatment   Substance use Disorder (SUD) Substance Use Disorder (SUD)  Checklist Symptoms of Substance Use: Continued use despite having a persistent/recurrent physical/psychological problem caused/exacerbated by use  Recommendations for Services/Supports/Treatments: Recommendations for Services/Supports/Treatments Recommendations For Services/Supports/Treatments: Inpatient Hospitalization  DSM5 Diagnoses: Patient Active Problem List   Diagnosis Date Noted   Methamphetamine abuse ([place]) 11/29/2019   Methamphetamine use disorder, severe, dependence ([place]) 11/29/2019   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

## 2021-05-12 NOTE — BH Assessment (Signed)
Referral information for Psychiatric Hospitalization faxed to;   [facility] [phone]- [phone]),   [facility] ([phone]--[phone])  [facility] [phone] -[phone]) [phone]  [facility] [phone]),  [place] [phone], [phone], [phone] or [phone]),   [facility] [phone] or [phone])  [facility] [phone]),   [facility] [phone] -or- [phone]),   [facility] [phone] or [phone]),   [facility] [phone]).  [facility] [phone])

## 2021-05-13 LAB — RESP PANEL BY RT-PCR (FLU A&B, COVID) ARPGX2
Influenza A by PCR: NEGATIVE
Influenza B by PCR: NEGATIVE
SARS Coronavirus 2 by RT PCR: NEGATIVE

## 2021-05-13 MED ORDER — BENZTROPINE MESYLATE 1 MG PO TABS
1.0000 mg | ORAL_TABLET | Freq: Every day | ORAL | Status: DC
  Administered 2021-05-13 – 2021-05-14 (×2): 1 mg via ORAL
  Filled 2021-05-13 (×2): qty 1

## 2021-05-13 NOTE — BH Assessment (Signed)
Writer faxed medical note, urinalysis and negative Covid results to [facility] at [facility] [phone]. Awaiting accepting details.

## 2021-05-13 NOTE — BH Assessment (Signed)
Patient to be reviewed with [facility] today 05/13/21

## 2021-05-13 NOTE — ED Notes (Signed)
Patient given a snack

## 2021-05-13 NOTE — BH Assessment (Signed)
Patient is under review at [facility] pending negative Covid results.

## 2021-05-13 NOTE — BH Assessment (Signed)
Patient was denied at [facility] due to substance as primary and past aggression.

## 2021-05-14 ENCOUNTER — Inpatient Hospital Stay
Admission: RE | Admit: 2021-05-14 | Discharge: 2021-05-17 | DRG: 897 | Disposition: A | Discharge status: Home / Self Care | Payer: 59 | Source: Intra-hospital | Attending: Psychiatry | Admitting: Psychiatry

## 2021-05-14 ENCOUNTER — Other Ambulatory Visit: Payer: Self-pay

## 2021-05-14 ENCOUNTER — Encounter: Payer: Self-pay | Admitting: Psychiatry

## 2021-05-14 DIAGNOSIS — Z79899 Other long term (current) drug therapy: Secondary | ICD-10-CM | POA: Diagnosis not present

## 2021-05-14 DIAGNOSIS — Z20822 Contact with and (suspected) exposure to covid-19: Secondary | ICD-10-CM | POA: Diagnosis present

## 2021-05-14 DIAGNOSIS — F15959 Other stimulant use, unspecified with stimulant-induced psychotic disorder, unspecified: Secondary | ICD-10-CM | POA: Diagnosis present

## 2021-05-14 DIAGNOSIS — F32A Depression, unspecified: Secondary | ICD-10-CM | POA: Diagnosis present

## 2021-05-14 DIAGNOSIS — F419 Anxiety disorder, unspecified: Secondary | ICD-10-CM | POA: Diagnosis present

## 2021-05-14 MED ORDER — ALUM & MAG HYDROXIDE-SIMETH 200-200-20 MG/5ML PO SUSP: 30.0000 mL | ORAL | Status: DC | PRN

## 2021-05-14 MED ORDER — DULOXETINE HCL 30 MG PO CPEP
30.0000 mg | ORAL_CAPSULE | Freq: Every day | ORAL | Status: DC
Start: 2021-05-14 — End: 2021-05-14

## 2021-05-14 MED ORDER — HALOPERIDOL 5 MG PO TABS
5.0000 mg | ORAL_TABLET | Freq: Two times a day (BID) | ORAL | Status: DC
Start: 2021-05-14 — End: 2021-05-14

## 2021-05-14 MED ORDER — DULOXETINE HCL 30 MG PO CPEP
30.0000 mg | ORAL_CAPSULE | Freq: Every day | ORAL | Status: DC
  Administered 2021-05-15 – 2021-05-17 (×3): 30 mg via ORAL
  Filled 2021-05-14 (×3): qty 1

## 2021-05-14 MED ORDER — BENZTROPINE MESYLATE 1 MG PO TABS
1.0000 mg | ORAL_TABLET | Freq: Every day | ORAL | Status: DC
  Administered 2021-05-15 – 2021-05-17 (×3): 1 mg via ORAL
  Filled 2021-05-14 (×3): qty 1

## 2021-05-14 MED ORDER — ACETAMINOPHEN 325 MG PO TABS: 650.0000 mg | ORAL_TABLET | Freq: Four times a day (QID) | ORAL | Status: DC | PRN

## 2021-05-14 MED ORDER — HALOPERIDOL 5 MG PO TABS
5.0000 mg | ORAL_TABLET | Freq: Two times a day (BID) | ORAL | Status: DC
  Administered 2021-05-14 – 2021-05-17 (×6): 5 mg via ORAL
  Filled 2021-05-14 (×6): qty 1

## 2021-05-14 MED ORDER — MAGNESIUM HYDROXIDE 400 MG/5ML PO SUSP: 30.0000 mL | Freq: Every day | ORAL | Status: DC | PRN

## 2021-05-14 MED ORDER — BENZTROPINE MESYLATE 1 MG PO TABS
1.0000 mg | ORAL_TABLET | Freq: Every day | ORAL | Status: DC
Start: 2021-05-14 — End: 2021-05-14

## 2021-05-14 NOTE — Consult Note (Signed)
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 male patient admitted with drug induced psychosis.  Yesterday, the client denied suicidal and homicidal ideations.  He was only paranoid that his brother put cameras in his room to watch him.  He was interested in rehab, no bed available, he seemed to be improving without the meth with the hopes he would clear.  However, today he is more delusional, convinced that his brothers are working with the [place] to gather [employer] to have him arrested.  Based on his progression of symptoms, admitted to the geriatric psych unit for stabilization.  Per Waldon Merl, PMHNP, on admission: Writer called brother, Otniel Hoe, who patient is currently staying with [phone]). Brother states that he thinks patient may have been ingesting some "illicit drugs" but he has also threatened to harm him and is "psychotic", saying things about the FBI, threatening to harm brother and others. Brother states "He needs some mental health help." Brother would like to be called when patient is transferred to another hospital.   Past Psychiatric History: IV drug use; drug-induced psychosis  Risk to Self: none  Risk to Others:  none Prior Inpatient Therapy:  denies Prior Outpatient Therapy:  none  Past Medical History:  Past Medical History:  Diagnosis Date   History of kidney stones    Left breast abscess 2008   I&D required- [place], [place] (per patient)   Nodule of anterior chest wall 01/19/2017    Past Surgical History:  Procedure Laterality Date   ABCESS DRAINAGE Left 2008   Breast- [place], [place]   Family History:   Family History  Problem Relation Age of Onset   Diabetes Mother    Hypertension Mother    Heart attack Father    Family Psychiatric  History: unknown Social History:  Social History   Substance and Sexual Activity  Alcohol Use Not Currently   Comment: 12 Beers / Weekly     Social History   Substance and Sexual Activity  Drug Use Yes   Types: Methamphetamines    Social History   Socioeconomic History   Marital status: Single    Spouse name: Not on file   Number of children: Not on file   Years of education: Not on file   Highest education level: Not on file  Occupational History   Not on file  Tobacco Use   Smoking status: Never   Smokeless tobacco: Current    Types: Chew   Tobacco comments:    1/2 Can chew Daily  Vaping Use   Vaping Use: Never used  Substance and Sexual Activity   Alcohol use: Not Currently    Comment: 12 Beers / Weekly   Drug use: Yes    Types: Methamphetamines   Sexual activity: Not on file  Other Topics Concern   Not on file  Social History Narrative   Not on file   Social Determinants of Health   Financial Resource Strain: Not on file  Food Insecurity: Not on file  Transportation Needs: Not on file  Physical Activity: Not on file  Stress: Not on file  Social Connections: Not on file   Additional Social History:    Allergies:  No  Known Allergies  Labs:  Results for orders placed or performed during the hospital encounter of 05/11/21 (from the past 48 hour(s))  Resp Panel by RT-PCR (Flu A&B, Covid) Nasopharyngeal Swab     Status: None   Collection Time: 05/13/21 10:40 AM   Specimen: Nasopharyngeal Swab; Nasopharyngeal(NP) swabs in vial transport medium  Result Value Ref Range   SARS Coronavirus 2 by RT PCR NEGATIVE NEGATIVE    Comment: (NOTE) SARS-CoV-2 target nucleic acids are NOT DETECTED.  The SARS-CoV-2 RNA is generally detectable in upper respiratory specimens during the acute phase of infection. The  lowest concentration of SARS-CoV-2 viral copies this assay can detect is 138 copies/mL. A negative result does not preclude SARS-Cov-2 infection and should not be used as the sole basis for treatment or other patient management decisions. A negative result may occur with  improper specimen collection/handling, submission of specimen other than nasopharyngeal swab, presence of viral mutation(s) within the areas targeted by this assay, and inadequate number of viral copies(<138 copies/mL). A negative result must be combined with clinical observations, patient history, and epidemiological information. The expected result is Negative.  Fact Sheet for Patients:  EntrepreneurPulse.com.au  Fact Sheet for Healthcare Providers:  IncredibleEmployment.be  This test is no t yet approved or cleared by the [place] FDA and  has been authorized for detection and/or diagnosis of SARS-CoV-2 by FDA under an Emergency Use Authorization (EUA). This EUA will remain  in effect (meaning this test can be used) for the duration of the COVID-19 declaration under Section 564(b)(1) of the Act, 21 U.S.C.section 360bbb-3(b)(1), unless the authorization is terminated  or revoked sooner.       Influenza A by PCR NEGATIVE NEGATIVE   Influenza B by PCR NEGATIVE NEGATIVE    Comment: (NOTE) The Xpert Xpress SARS-CoV-2/FLU/RSV plus assay is intended as an aid in the diagnosis of influenza from Nasopharyngeal swab specimens and should not be used as a sole basis for treatment. Nasal washings and aspirates are unacceptable for Xpert Xpress SARS-CoV-2/FLU/RSV testing.  Fact Sheet for Patients: EntrepreneurPulse.com.au  Fact Sheet for Healthcare Providers: IncredibleEmployment.be  This test is not yet approved or cleared by the [place] FDA and has been authorized for detection and/or diagnosis of SARS-CoV-2 by FDA under an Emergency  Use Authorization (EUA). This EUA will remain in effect (meaning this test can be used) for the duration of the COVID-19 declaration under Section 564(b)(1) of the Act, 21 U.S.C. section 360bbb-3(b)(1), unless the authorization is terminated or revoked.  Performed at [facility], [address] [place], [place] [address]     No current facility-administered medications for this encounter.   Current Outpatient Medications  Medication Sig Dispense Refill   DULoxetine (CYMBALTA) 30 MG capsule Take 1 capsule (30 mg total) by mouth daily. 30 capsule 0   haloperidol (HALDOL) 5 MG tablet Take 1 tablet (5 mg total) by mouth 2 (two) times daily. 60 tablet 0   Facility-Administered Medications Ordered in Other Encounters  Medication Dose Route Frequency Provider Last Rate Last Admin   benztropine (COGENTIN) tablet 1 mg  1 mg Oral Daily Reita Cliche, Demontez Novack Y, NP   1 mg at 05/14/21 1038   DULoxetine (CYMBALTA) DR capsule 30 mg  30 mg Oral Daily Duffy Bruce, MD   30 mg at 05/14/21 1038   haloperidol (HALDOL) tablet 5 mg  5 mg Oral BID Duffy Bruce, MD   5 mg at 05/14/21 1038    Musculoskeletal: Strength & Muscle Tone: within normal  limits Gait & Station:  not observed Patient leans: N/A  Psychiatric Specialty Exam: Physical Exam Vitals and nursing note reviewed.  Constitutional:      Appearance: Normal appearance.  HENT:     Head: Normocephalic.     Nose: Nose normal.  Pulmonary:     Effort: Pulmonary effort is normal.  Musculoskeletal:        General: Normal range of motion.     Cervical back: Normal range of motion.  Neurological:     General: No focal deficit present.     Mental Status: He is alert and oriented to person, place, and time.  Psychiatric:        Attention and Perception: Attention and perception normal.        Mood and Affect: Mood and affect normal.        Speech: Speech normal.        Behavior: Behavior normal. Behavior is cooperative.         Thought Content: Thought content is paranoid and delusional.        Cognition and Memory: Cognition and memory normal.        Judgment: Judgment normal.    Review of Systems  Constitutional:  Positive for malaise/fatigue.  Psychiatric/Behavioral:  Positive for substance abuse.   All other systems reviewed and are negative.  Blood pressure 117/75, pulse (!) 58, temperature (!) 97.5 F (36.4 C), temperature source Oral, resp. rate 18, height 5\' 8"  (1.727 m), weight 75.5 kg, SpO2 98 %.Body mass index is 25.31 kg/m.  General Appearance: Disheveled  Eye Contact:  Fair  Speech:  Normal Rate  Volume:  Normal  Mood:  Euthymic  Affect:  Congruent  Thought Process:  Coherent and Descriptions of Associations: Intact  Orientation:  Full (Time, Place, and Person)  Thought Content:  Delusions  Suicidal Thoughts:  No  Homicidal Thoughts:  No  Memory:  Immediate;   Fair Recent;   Fair Remote;   Fair  Judgement:  Poor  Insight:  Lacking  Psychomotor Activity:  Decreased  Concentration:  Concentration: Fair and Attention Span: Fair  Recall:  [employer] of Knowledge:  Fair  Language:  Fair  Akathisia:  No  Handed:  Right  AIMS (if indicated):     Assets:  Housing Physical Health Resilience Social Support  ADL's:  Intact  Cognition:  WNL  Sleep:         Physical Exam: Physical Exam Vitals and nursing note reviewed.  Constitutional:      Appearance: Normal appearance.  HENT:     Head: Normocephalic.     Nose: Nose normal.  Pulmonary:     Effort: Pulmonary effort is normal.  Musculoskeletal:        General: Normal range of motion.     Cervical back: Normal range of motion.  Neurological:     General: No focal deficit present.     Mental Status: He is alert and oriented to person, place, and time.  Psychiatric:        Attention and Perception: Attention and perception normal.        Mood and Affect: Mood and affect normal.        Speech: Speech normal.        Behavior:  Behavior normal. Behavior is cooperative.        Thought Content: Thought content is paranoid and delusional.        Cognition and Memory: Cognition and memory normal.  Judgment: Judgment normal.   Review of Systems  Constitutional:  Positive for malaise/fatigue.  Psychiatric/Behavioral:  Positive for substance abuse.   All other systems reviewed and are negative. There were no vitals taken for this visit. There is no height or weight on file to calculate BMI.  Treatment Plan Summary: Methamphetamine induced psychosis with delusions: Continue Haldol 5 mg BID  EPS: Continue Cogentin 1 mg daily  Depression: Continue Cymbalta 30 mg daily  Disposition: Recommend psychiatric Inpatient admission when medically cleared.  Waylan Boga, NP 05/14/2021 12:58 PM

## 2021-05-14 NOTE — Tx Team (Signed)
Initial Treatment Plan 05/14/2021 1:52 PM [name] YOF:[id number]   PATIENT STRESSORS: Marital or family conflict   Medication change or noncompliance     PATIENT STRENGTHS: Communication skills  Motivation for treatment/growth  Physical Health    PATIENT IDENTIFIED PROBLEMS: Conflict with brothers  Lack of social support                   DISCHARGE CRITERIA:  Adequate post-discharge living arrangements Improved stabilization in mood, thinking, and/or behavior  PRELIMINARY DISCHARGE PLAN: Outpatient therapy Placement in alternative living arrangements  PATIENT/FAMILY INVOLVEMENT: This treatment plan has been presented to and reviewed with the patient, Nicholas Lindsey.  The patient has been given the opportunity to ask questions and make suggestions.  Sunday Spillers, RN 05/14/2021, 1:52 PM

## 2021-05-14 NOTE — ED Notes (Signed)
Pt denies pain, nausea; denies any needs; pt resting calmly on stretcher; pt denies HI/SI currently.

## 2021-05-14 NOTE — ED Provider Notes (Signed)
Emergency Medicine Observation Re-evaluation Note  MARKAS ALDREDGE is a 56 y.o. male, seen on rounds today.  Pt initially presented to the ED for complaints of Homicidal Currently, the patient is resting.  Physical Exam  BP 129/76 (BP Location: Left Arm)    Pulse (!) 42    Temp 97.9 F (36.6 C)    Resp 18    Ht 1.727 m (5\' 8" )    Wt 81.6 kg    SpO2 96%    BMI 27.37 kg/m  Physical Exam Gen:  No acute distress Resp:  Breathing easily and comfortably, no accessory muscle usage Neuro:  Moving all four extremities, no gross focal neuro deficits Psych:  Resting currently, calm when awake  ED Course / MDM  EKG:   I have reviewed the labs performed to date as well as medications administered while in observation.  Recent changes in the last 24 hours include no acute abnormalities.  Plan  Current plan is for Psych placement. [name] is under involuntary commitment.      Hinda Kehr, MD 05/14/21 [phone]

## 2021-05-14 NOTE — ED Notes (Signed)
Patient given lunch tray.

## 2021-05-14 NOTE — BHH Counselor (Signed)
Adult Comprehensive Assessment  Patient ID: JARRET TORRE, male   DOB: 1966-01-09, 56 y.o.   MRN: [id number]  Information Source: Information source: Patient  Current Stressors:  Patient states their primary concerns and needs for treatment are:: "To get that meth out of my system and get some kind of plan for rehab for [facility]." Patient states their goals for this hospitilization and ongoing recovery are:: "To get my head cleared and get a plan where I can go so I won't be homeless." Educational / Learning stressors: Patient denies Employment / Job issues: Patient is currently unemployed. Patient was previously living at [facility] but states he was working for housing and food and has not recieved an income for the past 2 months due to leaving the program and being readmitted. Family Relationships: "My brother is dissapointed in me." Financial / Lack of resources (include bankruptcy): "Money is about to run out, I have $100 and something dollars and that's all i've got." Housing / Lack of housing: Patient has been residing with his brother the past 4-5 weeks. Patient believes that the FBI is "bugging" his room and his phone. He believes that his brother is working for the [employer] so his brother does not go to jail "and he has me in the middle of it." Patient states he cannot go back to live with his brother due to being spied on by the FBI at brother's home. Physical health (include injuries & life threatening diseases): Patient reports, "my prostate is giving me trouble." Patient states he has not been seen by a doctor to address this. Social relationships: Patient denies Substance abuse: Patient reports smoking methamphetamine 2 times in the last 2 weeks. Patient denies alcohol use and other illicit drug use. Bereavement / Loss: Patient denies  Living/Environment/Situation:  Living Arrangements: Other relatives Living conditions (as described by patient or guardian):  "Terrible, he (patient's brother) aint clean" Who else lives in the home?: Patient's brother How long has patient lived in current situation?: 4-5 weeks. Prior to this, patient was living at [facility]. What is atmosphere in current home: Chaotic  Family History:  Marital status: Divorced Divorced, when?: "A long time ago" What types of issues is patient dealing with in the relationship?: Patient was unable to answer question. Are you sexually active?: Yes What is your sexual orientation?: Heterosexual Does patient have children?: Yes How many children?: 2 (Son and daughter) How is patient's relationship with their children?: "I don't have one."  Childhood History:  By whom was/is the patient raised?: Both parents Description of patient's relationship with caregiver when they were a child: "Good" Patient's description of current relationship with people who raised him/her: Patient's mother and father are deceased. Does patient have siblings?: Yes Number of Siblings: 16 (Brothers) Description of patient's current relationship with siblings: "Ain't that good". Per chart review, one of patient's siblings passed away 2 years ago due to a drug overdose. Did patient suffer any verbal/emotional/physical/sexual abuse as a child?: No Did patient suffer from severe childhood neglect?: No Has patient ever been sexually abused/assaulted/raped as an adolescent or adult?: No Was the patient ever a victim of a crime or a disaster?: No Witnessed domestic violence?: No Has patient been affected by domestic violence as an adult?: No  Education:  Highest grade of school patient has completed: 12th grade Currently a student?: No Learning disability?: No  Employment/Work Situation:   Employment Situation: Unemployed What is the Longest Time Patient has Held a  Job?: 12 years Where was the Patient Employed at that Time?: Engineer, civil (consulting) Has Patient ever Been in the [employer]?:  No  Financial Resources:   Financial resources: No income Does patient have a Programmer, applications or guardian?: No  Alcohol/Substance Abuse:   What has been your use of drugs/alcohol within the last 12 months?: Patient states he has smoked methamphetamine 2x in the past month. Per chart review, patient states he injected rather than smoked methamphetamine. UDS is positive for amphetamine upon admission. Patient states he also smoked methamphetamine 2 months ago for 2-3 weeks. Prior to these two relapses, patient reports living at [facility] and was sober for 1 year. Patient denies alcohol use and other illicit drug use. If attempted suicide, did drugs/alcohol play a role in this?:  (Patient denies a history of suicide attempts) Alcohol/Substance Abuse Treatment Hx: Past Tx, Inpatient, Attends AA/NA If yes, describe treatment: Patient 1 year of sustained sobriety at [facility]. Patient reports he recently left [facility] [employer] 2 months ago, returned to use for 2-3 weeks, moved back to [facility], and moved in with his brother 4-5 weeks ago when he returned to using methamphetamines again. Patient reports a history of inpatient treatment at [facility] in [facility] approximately 20 years ago and a history of AA attendance. 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 Type of faith/religion: "I believe in God." How does patient's faith help to cope with current illness?: "I ask him to help me."  Leisure/Recreation:   Do You Have Hobbies?: No  Strengths/Needs:   What is the patient's perception of their strengths?: Patient declined to answer Patient states they can use these personal strengths during  their treatment to contribute to their recovery: Patient declined to answer Patient states these barriers may affect/interfere with their treatment: Patient denied Patient states these barriers may affect their return to the community: Patient denied  Discharge Plan:   Currently receiving community mental health services: No Patient states concerns and preferences for aftercare planning are: Patient expresses interested in substance use treatment. Patient is also open to returning to [facility]. Patient states they will know when they are safe and ready for discharge when: "I don't know." Does patient have access to transportation?: No Does patient have financial barriers related to discharge medications?: Yes Patient description of barriers related to discharge medications: Patient is not insured and is unemployed. Plan for no access to transportation at discharge: CSW to assist patient with transportation. Plan for living situation after discharge: Patient requests substance use treatment. Will patient be returning to same living situation after discharge?: No  Summary/Recommendations:   Summary and Recommendations (to be completed by the evaluator): Patient is a 56 year old divorced male from [place], [place] ([place] Patient presented to [facility] ED involuntarily and admitted with drug induced psychosis. During admission, patient endorsed homicidal ideation towards his brother. Patient reports methamphetamine use for the past 3 weeks. On admission, UDS is positive for amphetamines. Patient reports a history of methamphetamine use and chart review reveals a history of drug-induced psychosis. During assessment, patient is oriented x4. Patients presents with an irritable affect and disorganized thought process. Patient expresses paranoid delusions, stating that he has been residing with his brother for the past month and reports  that his room and phone are bugged by the [place]. Patient  has been involved with [employer] for the past year and left the program about one month ago to live with his brother when he returned to the use of methamphetamines. Patient reports stress regarding housing insecurity and no income and requests inpatient substance use treatment. Patient is also open to returning to [facility]  Patient does not currently receive community mental health treatment. Recommendations include: crisis stabilization, therapeutic milieu, encourage group attendance and participation, medication management for detox/mood stabilization and development of comprehensive mental wellness/sobriety plan.  Kenna Gilbert Bayley Hurn. 05/14/2021

## 2021-05-14 NOTE — BHH Suicide Risk Assessment (Signed)
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 male patient with a history of substance abuse, methamphetamine-induced psychosis in the past, admitted with drug-induced psychosis.  Physical Exam: Physical Exam ROS Blood pressure 117/75, pulse (!) 58, temperature (!) 97.5 F (36.4 C), temperature source Oral, resp. rate 18, height 5\' 8"  (1.727 m), weight 75.5 kg, SpO2 98 %. Body mass index is 25.31 kg/m.  SUICIDE RISK:   Mild:  Suicidal ideation of limited frequency, intensity, duration, and specificity.  There are no identifiable plans, no associated intent, mild dysphoria and related symptoms, good self-control (both objective and subjective assessment), few other risk factors, and identifiable protective factors, including available and accessible social support.  PLAN OF CARE: continue inpatient psych admission, medication management, counseling, collat info from family, psychoeducation.  I certify that inpatient services furnished can reasonably be expected to improve the patient's condition.   Larita Fife, MD 05/14/2021, 3:49 PM

## 2021-05-14 NOTE — H&P (Signed)
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 male patient with a history of substance abuse, methamphetamine-induced psychosis in the past, admitted with drug-induced psychosis.  Labs: CBC, CMP - mostly WNL. UDS pos for methamphetamines. BAL < 10.  History of Present Illness:  Patient was brought to the ED on 05/11/21 with suicidal ideation and paranoia. Per ER Physician note: "The patient states that he is here because he has been off of his medications.  He states he feels like his brother is poisoning him and that he wants to kill him.  Per report, he has also been making statements about FBI after him.  He is markedly agitated on arrival, pacing the room, limiting further history.  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  He does admit to recent amphetamine use."  The next day in the ER, when consulted by Psych NP, "yesterday Nicholas Lindsey denied suicidal and homicidal ideations.  He was only paranoid that his brother put cameras in his room to watch him.  He was interested in rehab, no bed available, he seemed to be improving without the meth with the hopes he would clear.  However, today he is more delusional, convinced that his brothers are working with the [place] to gather [employer] to have him arrested.  Based on his progression of symptoms, admitted to the geriatric psych unit for stabilization."  Patient seen in the [place] unit. Patient reports "I was feeling unsafe home with brothers". He admits to meth use. He admits to having similar issues with his mood and perception in the past in settings or meth use. He says  "I just need to sleep". He understands he was admitted to Psych unit and getting medications "to clear my mind". He denies feeling depressed. He is anxious due to his delusional believes. Reports feeling safe in the hospital. He denies thoughts/plans of harming self or others.      Total Time spent with patient: 30 minutes  Past Psychiatric History: methamphetamine abuse. Several ER visits due to methamphetamine-induced psychosis. Past psych medications: Abilify, Duloxetine. ] Is the patient at risk to self? No.  Has the patient been a risk to self in the past 6 months? No.  Has the patient been a risk to self within the distant past? No.  Is the patient a risk to others? No.  Has the patient been a risk to others in the past 6 months? No.  Has the patient been a risk to others within the distant past? No.   Prior Inpatient Therapy:   Prior Outpatient Therapy:    Alcohol Screening: 1. How often do you have a drink containing alcohol?: Never 2. How many drinks containing alcohol do you have on a typical day when you are drinking?: 1 or 2 3. How often do you have six or more drinks on one occasion?: Never AUDIT-C Score: 0 4. How often during the last year have you found that you were not able to stop drinking once you had started?: Never 5. How often during the last year have you failed to do what was normally expected from you because of drinking?: Never 6. How often during the last year have you needed a first drink in the morning to  get yourself going after a heavy drinking session?: Never 7. How often during the last year have you had a feeling of guilt of remorse after drinking?: Never 8. How often during the last year have you been unable to remember what happened the night before because you had been drinking?: Never 9. Have you or someone else been injured as a result of your drinking?: No 10. Has a relative or friend or a doctor or another health worker been concerned about your  drinking or suggested you cut down?: No Alcohol Use Disorder Identification Test Final Score (AUDIT): 0 Substance Abuse History in the last 12 months:  Yes.   Consequences of Substance Abuse: Medical Consequences:  psych admission Previous Psychotropic Medications: Yes  Psychological Evaluations: Yes  Past Medical History:  Past Medical History:  Diagnosis Date   History of kidney stones    Left breast abscess 2008   I&D required- [place], [place] (per patient)   Nodule of anterior chest wall 01/19/2017    Past Surgical History:  Procedure Laterality Date   ABCESS DRAINAGE Left 2008   Breast- [place], [place]   Family History:  Family History  Problem Relation Age of Onset   Diabetes Mother    Hypertension Mother    Heart attack Father    Family Psychiatric  History: unknown Tobacco Screening:   Social History:  Social History   Substance and Sexual Activity  Alcohol Use Not Currently   Comment: 12 Beers / Weekly     Social History   Substance and Sexual Activity  Drug Use Yes   Types: Methamphetamines    Additional Social History:                           Allergies:  No Known Allergies Lab Results:  Results for orders placed or performed during the hospital encounter of 05/11/21 (from the past 48 hour(s))  Resp Panel by RT-PCR (Flu A&B, Covid) Nasopharyngeal Swab     Status: None   Collection Time: 05/13/21 10:40 AM   Specimen: Nasopharyngeal Swab; Nasopharyngeal(NP) swabs in vial transport medium  Result Value Ref Range   SARS Coronavirus 2 by RT PCR NEGATIVE NEGATIVE    Comment: (NOTE) SARS-CoV-2 target nucleic acids are NOT DETECTED.  The SARS-CoV-2 RNA is generally detectable in upper respiratory specimens during the acute phase of infection. The lowest concentration of SARS-CoV-2 viral copies this assay can detect is 138 copies/mL. A negative result does not preclude SARS-Cov-2 infection and should not be used as the sole basis for treatment  or other patient management decisions. A negative result may occur with  improper specimen collection/handling, submission of specimen other than nasopharyngeal swab, presence of viral mutation(s) within the areas targeted by this assay, and inadequate number of viral copies(<138 copies/mL). A negative result must be combined with clinical observations, patient history, and epidemiological information. The expected result is Negative.  Fact Sheet for Patients:  EntrepreneurPulse.com.au  Fact Sheet for Healthcare Providers:  IncredibleEmployment.be  This test is no t yet approved or cleared by the [place] FDA and  has been authorized for detection and/or diagnosis of SARS-CoV-2 by FDA under an Emergency Use Authorization (EUA). This EUA will remain  in effect (meaning this test can be used) for the duration of the COVID-19 declaration under Section 564(b)(1) of the Act, 21 U.S.C.section 360bbb-3(b)(1), unless the authorization is terminated  or revoked sooner.       Influenza A by PCR NEGATIVE  NEGATIVE   Influenza B by PCR NEGATIVE NEGATIVE    Comment: (NOTE) The Xpert Xpress SARS-CoV-2/FLU/RSV plus assay is intended as an aid in the diagnosis of influenza from Nasopharyngeal swab specimens and should not be used as a sole basis for treatment. Nasal washings and aspirates are unacceptable for Xpert Xpress SARS-CoV-2/FLU/RSV testing.  Fact Sheet for Patients: EntrepreneurPulse.com.au  Fact Sheet for Healthcare Providers: IncredibleEmployment.be  This test is not yet approved or cleared by the [place] FDA and has been authorized for detection and/or diagnosis of SARS-CoV-2 by FDA under an Emergency Use Authorization (EUA). This EUA will remain in effect (meaning this test can be used) for the duration of the COVID-19 declaration under Section 564(b)(1) of the Act, 21 U.S.C. section  360bbb-3(b)(1), unless the authorization is terminated or revoked.  Performed at [facility], [address] [place], [place] [address]     Blood Alcohol level:  Lab Results  Component Value Date   [facility] <10 05/11/2021   ETH <10 62/70/3500    Metabolic Disorder Labs:  Lab Results  Component Value Date   HGBA1C 5.6 09/10/2019   No results found for: PROLACTIN Lab Results  Component Value Date   CHOL 132 09/10/2019   TRIG 46 09/10/2019   HDL 49 09/10/2019   CHOLHDL 2.7 09/10/2019   LDLCALC 72 09/10/2019    Current Medications: Current Facility-Administered Medications  Medication Dose Route Frequency Provider Last Rate Last Admin   acetaminophen (TYLENOL) tablet 650 mg  650 mg Oral Q6H PRN [name], NP       alum & mag hydroxide-simeth (MAALOX/MYLANTA) 200-200-20 MG/5ML suspension 30 mL  30 mL Oral Q4H PRN [name], NP       Derrill Memo ON 05/15/2021] benztropine (COGENTIN) tablet 1 mg  1 mg Oral Daily [name], NP       Derrill Memo ON 05/15/2021] DULoxetine (CYMBALTA) DR capsule 30 mg  30 mg Oral Daily [name], NP       haloperidol (HALDOL) tablet 5 mg  5 mg Oral BID [name], NP       magnesium hydroxide (MILK OF MAGNESIA) suspension 30 mL  30 mL Oral Daily PRN [name], NP       PTA Medications: Medications Prior to Admission  Medication Sig Dispense Refill Last Dose   DULoxetine (CYMBALTA) 30 MG capsule Take 1 capsule (30 mg total) by mouth daily. 30 capsule 0    haloperidol (HALDOL) 5 MG tablet Take 1 tablet (5 mg total) by mouth 2 (two) times daily. 60 tablet 0     Musculoskeletal: Strength & Muscle Tone: within normal limits Gait & Station: normal Patient leans: N/A    Psychiatric Specialty Exam:  Appearance:  CM, appearing stated age, wearing appropriate to the situation hospital clothes. Decreased level of alertness, confused facial expression.  Attitude/Behavior: calm, cooperative, limitedly-engaging with poor  eye contact.  Motor: WNL; dyskinesias not evident.   Speech: spontaneous, clear, coherent, normal comprehension.  Mood: "I don`t know ".  Affect: blunted  Thought process: patient appears coherent, disorganized, illogical.  Thought content: expresses paranoid delusions; patient denies suicidal thoughts, denies homicidal thoughts.  Thought perception: patient denies auditory and visual hallucinations. Did not appear internally stimulated.  Cognition: patient is alert and oriented in self, place, date.  Insight: poor  Judgement: poor  Assets  Assets:Communication Skills; Resilience; Social Support; Financial Resources/Insurance   Sleep  Sleep:No data recorded   Physical Exam: Physical Exam Constitutional:  Appearance: Normal appearance.  HENT:     Head: Normocephalic and atraumatic.  Eyes:     Extraocular Movements: Extraocular movements intact.     Pupils: Pupils are equal, round, and reactive to light.  Cardiovascular:     Rate and Rhythm: Normal rate and regular rhythm.  Pulmonary:     Effort: Pulmonary effort is normal.     Breath sounds: Normal breath sounds.  Abdominal:     General: Abdomen is flat.     Palpations: Abdomen is soft.  Musculoskeletal:        General: Normal range of motion.     Cervical back: Normal range of motion.  Skin:    General: Skin is warm and dry.  Neurological:     General: No focal deficit present.     Mental Status: He is alert and oriented to person, place, and time.  Psychiatric:        Mood and Affect: Mood normal.        Behavior: Behavior normal.   Review of Systems  Constitutional:  Negative for chills and fever.  HENT:  Negative for hearing loss.   Eyes:  Negative for blurred vision.  Respiratory:  Negative for cough and shortness of breath.   Cardiovascular:  Negative for chest pain.  Gastrointestinal:  Negative for abdominal pain.  Skin:  Negative for rash.  Neurological:  Negative for focal weakness.   Psychiatric/Behavioral:  Positive for substance abuse. Negative for depression and suicidal ideas. The patient is nervous/anxious.   Blood pressure 117/75, pulse (!) 58, temperature (!) 97.5 F (36.4 C), temperature source Oral, resp. rate 18, height 5\' 8"  (1.727 m), weight 75.5 kg, SpO2 98 %. Body mass index is 25.31 kg/m.  Treatment Plan Summary: Daily contact with patient to assess and evaluate symptoms and progress in treatment and Medication management  ASSESSMENT: Patient is seen and examined.  Patient is a 56 year old male with the above-stated past psychiatric and medical history who was admitted on 05/14/21 to Geriatric Psychiatry unit secondary to drug-induced psychosis.  PLAN: -inpatient psychiatric admission will be continued. -patient will be integrated in the milieu.   -patient will be encouraged to attend groups.   -Medications: We will continue started in ER Haldol 5mg  PO BID for psychotic symptoms as well as Cogentin to EPS propx. Ok to continue Cymbalta 30 mg PO daily to help his anxiety and depression.   -will obtain EKG. -Disposition will be determined after the patient is stabilized.    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[place]) Long Term Goal(s): Improvement in symptoms so as ready for discharge  Short Term Goals: Ability to identify changes in lifestyle to reduce recurrence of condition will improve, Ability to verbalize feelings will improve, Ability to disclose and discuss suicidal ideas, Ability to demonstrate self-control will improve, Ability to identify and develop effective coping behaviors will improve, Ability to maintain clinical measurements within normal limits will improve, Compliance with prescribed medications will improve, and  Ability to identify triggers associated with substance abuse/mental health issues will improve  Physician Treatment Plan for Secondary Diagnosis: Principal Problem:   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[place])  Long Term Goal(s): Improvement in symptoms so as ready for discharge  Short Term Goals: Ability to identify changes in lifestyle  to reduce recurrence of condition will improve, Ability to verbalize feelings will improve, Ability to disclose and discuss suicidal ideas, Ability to demonstrate self-control will improve, Ability to identify and develop effective coping behaviors will improve, Ability to maintain clinical measurements within normal limits will improve, Compliance with prescribed medications will improve, and Ability to identify triggers associated with substance abuse/mental health issues will improve  I certify that inpatient services furnished can reasonably be expected to improve the patient's condition.    Larita Fife, MD 2/4/20233:24 PM

## 2021-05-14 NOTE — Progress Notes (Signed)
Patient admitted IVC to the [facility] from ED with diagnosis of methamphetamine-induced psychotic disorder. Patient presents to unit ambulatory A&Ox4.  Patient states, "brother's wear working with [employer], bugged his room and spying on him for the FBI." Patient is calm, cooperative and pleasant during assessment.  Patient denies pain, endorses depression and anxiety rating 6/10 stating his main stressors are conflict with brothers and lack of social support. Patient currently denies suicidal ideations, homicidal ideations, audio or visual hallucinations and verbally contracts for safety on unit. Patient denies smoking or ETOH use, but verbalize the use of methamphetamine Patient reports living with brothers. When asked what his strengths are or the goals he would like to work on while here pt states, "get my head straight, going to rehab or [facility] rescue mission ."  Body assessment done, patient observed with dry, flaky skin, thick discolored finger and toe nails, dry and scaly heel. Amputed left pink per patient it happened twenty years ago.   Emotional support and reassurance provided throughout admission intake. Afterwards, oriented patient to unit, room and call light, reviewed plan of care with all questions answered and verbalized understanding. Denies any needs at this time. Currently in his room in bed in no apparent distress. Will continue to monitor with ongoing Q 15 minute safety checks per unit protocol.

## 2021-05-14 NOTE — ED Notes (Signed)
IVC pending placement 

## 2021-05-14 NOTE — BH Assessment (Signed)
Writer spoke with the patient to complete an updated/reassessment. Patient denies SI/HI but he still believes his brothers are "bugging" his room and that they are working with the [place].

## 2021-05-14 NOTE — ED Notes (Signed)
Pt given meal tray- pt awake and lights turned on

## 2021-05-14 NOTE — BHH Suicide Risk Assessment (Signed)
[place] INPATIENT:  Family/Significant Other Suicide Prevention Education  Suicide Prevention Education:  Patient Refusal for Family/Significant Other Suicide Prevention Education: The patient Nicholas Lindsey has refused to provide written consent for family/significant other to be provided Family/Significant Other Suicide Prevention Education during admission and/or prior to discharge.  Physician notified.  SPE completed with pt, as pt refused to consent to family contact. SPI pamphlet provided to pt and pt was encouraged to share information with support network, ask questions, and talk about any concerns relating to SPE. Pt denies access to guns/firearms and verbalized understanding of information provided. Mobile Crisis information also provided to pt.   Kenna Gilbert Huxley Shurley 05/14/2021, 5:52 PM

## 2021-05-15 DIAGNOSIS — F15959 Other stimulant use, unspecified with stimulant-induced psychotic disorder, unspecified: Secondary | ICD-10-CM | POA: Diagnosis not present

## 2021-05-15 NOTE — Progress Notes (Signed)
Patient  was compliant with medication.  He denies SI, HI, or AVH.  He remained in his room throughout the night.  He is A & O x4.  Daily Q15 minutes safety checks on unit to keep the patient safe per unit protocol.

## 2021-05-15 NOTE — Group Note (Signed)
LCSW Group Therapy Note  Group Date: 05/15/2021 Start Time: 9179 End Time: 1415   Type of Therapy and Topic:  Group Therapy - How To Cope with Nervousness about Discharge   Participation Level:  Did Not Attend   Description of Group This process group involved identification of patients' feelings about discharge. Some of them are scheduled to be discharged soon, while others are new admissions, but each of them was asked to share thoughts and feelings surrounding discharge from the hospital. One common theme was that they are excited at the prospect of going home, while another was that many of them are apprehensive about sharing why they were hospitalized. Patients were given the opportunity to discuss these feelings with their peers in preparation for discharge.  Therapeutic Goals  Patient will identify their overall feelings about pending discharge. Patient will think about how they might proactively address issues that they believe will once again arise once they get home (i.e. with parents). Patients will participate in discussion about having hope for change.   Summary of Patient Progress:  Patient did not attend group despite encouraged participation.   Therapeutic Modalities Cognitive Behavioral Therapy   Sherilyn Dacosta 05/15/2021  2:51 PM

## 2021-05-15 NOTE — Progress Notes (Signed)
[facility] MD Progress Note  05/15/2021 2:34 PM Nicholas Lindsey  MRN:  [id number]  Principal Problem: Methamphetamine-induced psychotic disorder [facility]) Diagnosis: Principal Problem:   Methamphetamine-induced psychotic disorder [facility])  Nicholas Lindsey is a 56 y.o. male patient with a history of substance abuse, methamphetamine-induced psychosis in the past, admitted with drug-induced psychosis.  Interval History Patient was seen today for re-evaluation.  Nursing reports no events overnight. The patient has no issues with performing ADLs.  Patient has been medication compliant.    Subjective:  On assessment patient reports "I am feeling better". Reports better mood, denies feeling depressed. He is still somewhat confused, says he is not sure if he feels safe to go back to stay with brother, probably he is still paranoid. He expresses his interest in going to [place], says he is planning to call them. Reports feeling safe in the hospital. His sleep is good. His appetite is low. He denies thoughts/plans of harming self or others. Denies hallucinations. No side effects from medications.   Labs: no new results for review.   Total Time spent with patient: 20 minutes  Past Psychiatric History: methamphetamine abuse. Several ER visits due to methamphetamine-induced psychosis. Past psych medications: Abilify, Duloxetine.   Past Medical History:  Past Medical History:  Diagnosis Date   History of kidney stones    Left breast abscess 2008   I&D required- [place], [place] (per patient)   Nodule of anterior chest wall 01/19/2017    Past Surgical History:  Procedure Laterality Date   ABCESS DRAINAGE Left 2008   Breast- [place], [place]   Family History:  Family History  Problem Relation Age of Onset   Diabetes Mother    Hypertension Mother    Heart attack Father    Family Psychiatric  History: unknown Social History:  Social History   Substance and Sexual Activity  Alcohol Use Not Currently    Comment: 12 Beers / Weekly     Social History   Substance and Sexual Activity  Drug Use Yes   Types: Methamphetamines    Social History   Socioeconomic History   Marital status: Single    Spouse name: Not on file   Number of children: Not on file   Years of education: Not on file   Highest education level: Not on file  Occupational History   Not on file  Tobacco Use   Smoking status: Never   Smokeless tobacco: Current    Types: Chew   Tobacco comments:    1/2 Can chew Daily  Vaping Use   Vaping Use: Never used  Substance and Sexual Activity   Alcohol use: Not Currently    Comment: 12 Beers / Weekly   Drug use: Yes    Types: Methamphetamines   Sexual activity: Not on file  Other Topics Concern   Not on file  Social History Narrative   Not on file   Social Determinants of Health   Financial Resource Strain: Not on file  Food Insecurity: Not on file  Transportation Needs: Not on file  Physical Activity: Not on file  Stress: Not on file  Social Connections: Not on file   Additional Social History:                         Sleep: Fair  Appetite:  Fair  Current Medications: Current Facility-Administered Medications  Medication Dose Route Frequency Provider Last Rate Last Admin   acetaminophen (TYLENOL) tablet 650 mg  650  mg Oral Q6H PRN [name], NP       alum & mag hydroxide-simeth (MAALOX/MYLANTA) 200-200-20 MG/5ML suspension 30 mL  30 mL Oral Q4H PRN [name], NP       benztropine (COGENTIN) tablet 1 mg  1 mg Oral Daily [name], NP   1 mg at 05/15/21 1007   DULoxetine (CYMBALTA) DR capsule 30 mg  30 mg Oral Daily [name], NP   30 mg at 05/15/21 1007   haloperidol (HALDOL) tablet 5 mg  5 mg Oral BID [name], NP   5 mg at 05/15/21 1007   magnesium hydroxide (MILK OF MAGNESIA) suspension 30 mL  30 mL Oral Daily PRN [name], NP        Lab Results: No results found for this or any previous visit (from the  past 48 hour(s)).  Blood Alcohol level:  Lab Results  Component Value Date   ETH <10 05/11/2021   ETH <10 99/83/3825    Metabolic Disorder Labs: Lab Results  Component Value Date   HGBA1C 5.6 09/10/2019   No results found for: PROLACTIN Lab Results  Component Value Date   CHOL 132 09/10/2019   TRIG 46 09/10/2019   HDL 49 09/10/2019   CHOLHDL 2.7 09/10/2019   LDLCALC 72 09/10/2019    Physical Findings: AIMS:  , ,  ,  ,    CIWA:    COWS:     Musculoskeletal: Strength & Muscle Tone: within normal limits Gait & Station: normal Patient leans: N/A  Psychiatric Specialty Exam: Appearance:  CM, appearing stated age, wearing appropriate to the situation hospital clothes. Decreased level of alertness, confused facial expression.   Attitude/Behavior: calm, cooperative, limitedly-engaging with poor eye contact.   Motor: WNL; dyskinesias not evident.    Speech: spontaneous, clear, coherent, normal comprehension.   Mood: "better ".   Affect: blunted   Thought process: patient appears coherent, better organized, somewhat illogical still.   Thought content: likely still having paranoid delusions; patient denies suicidal thoughts, denies homicidal thoughts.   Thought perception: patient denies auditory and visual hallucinations. Did not appear internally stimulated.   Cognition: patient is alert and oriented in self, place, date.   Insight: limited   Judgement: questionable, improving.    Physical Exam: Physical Exam ROS Blood pressure 125/74, pulse (!) 52, temperature (!) 97.3 F (36.3 C), temperature source Oral, resp. rate 18, height 5\' 8"  (1.727 m), weight 75.5 kg, SpO2 98 %. Body mass index is 25.31 kg/m.   Treatment Plan Summary: Daily contact with patient to assess and evaluate symptoms and progress in treatment and Medication management  Patient is a 56 year old male with the above-stated past psychiatric history who is seen in follow-up.  Chart reviewed.  Patient discussed with nursing. Patient appears less confused, although is still paranoid. He is interested in addiction treatment program. No changes in medicines today.   Plan:  -continue inpatient psych admission; 15-minute checks; daily contact with patient to assess and evaluate symptoms and progress in treatment; psychoeducation.  -continue scheduled medications: Haldol 5mg  PO BID for psychotic symptoms a Cogentin to EPS propx.  Cymbalta 30 mg PO daily to help his anxiety and depression.    -continue PRN medications.  acetaminophen, alum & mag hydroxide-simeth, magnesium hydroxide   -Disposition: Estimated duration of hospitalization: early-to-midweek next week. Social worker to help patient get into addiction program versus shelter. All necessary aftercare will be arranged prior to discharge   -  I certify that the patient does need, on a daily basis, active treatment furnished directly by or requiring the supervision of inpatient psychiatric facility personnel.    Larita Fife, MD 05/15/2021, 2:34 PM

## 2021-05-15 NOTE — Plan of Care (Addendum)
Patient presents A&O x 4. Patient affect is sad, depressed and behavior withdrawn and isolative.  Denies AVH, SI, HI, depression, anxiety or pain.  Reports sleeping well.  VSS.  Patient compliant with all scheduled meds.  Patient spent most of shift in room except for meals. Ongoing Q15 minute safety check rounds per unit protocol.  Problem: Education: Goal: Knowledge of [facility] Education information/materials will improve Outcome: Progressing Goal: Verbalization of understanding the information provided will improve Outcome: Progressing   Problem: Activity: Goal: Sleeping patterns will improve Outcome: Progressing   Problem: Coping: Goal: Ability to demonstrate self-control will improve Outcome: Progressing   Problem: Health Behavior/Discharge Planning: Goal: Compliance with treatment plan for underlying cause of condition will improve Outcome: Progressing

## 2021-05-16 DIAGNOSIS — F15959 Other stimulant use, unspecified with stimulant-induced psychotic disorder, unspecified: Secondary | ICD-10-CM | POA: Diagnosis not present

## 2021-05-16 NOTE — Progress Notes (Signed)
Recreation Therapy Notes  INPATIENT RECREATION TR PLAN  Patient Details Name: Nicholas Lindsey MRN: [id number] DOB: [birth year] Today's Date: 05/16/2021  Rec Therapy Plan Is patient appropriate for Therapeutic Recreation?: Yes Treatment times per week: at least 3 Estimated Length of Stay: 5-7 days TR Treatment/Interventions: Group participation (Comment)  Discharge Criteria Pt will be discharged from therapy if:: Discharged Treatment plan/goals/alternatives discussed and agreed upon by:: Patient/family  Discharge Summary     Lisbeth Puller 05/16/2021, 4:02 PM

## 2021-05-16 NOTE — Progress Notes (Signed)
[facility] MD Progress Note  05/16/2021 10:38 AM Nicholas Lindsey  MRN:  [id number] Subjective: Nicholas Lindsey is seen today.  He denies any withdrawal symptoms from methamphetamine.  He denies any auditory or visual hallucinations.  He denies any suicidal or homicidal ideation.  He plans on attending a program in [place].  He is taking his medications as prescribed and denies any side effects.  There is no evidence of EPS or TD.  Principal Problem: 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[place]) Diagnosis: Principal Problem:   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[place])  Total Time spent with patient: 15 minutes  Past Psychiatric History: Unremarkable  Past Medical History:  Past Medical History:  Diagnosis Date   History of kidney stones    Left breast abscess 2008   I&D required- [place], [place] (per patient)   Nodule of anterior chest wall 01/19/2017    Past Surgical History:  Procedure Laterality Date   ABCESS DRAINAGE Left 2008   Breast- [place], [place]   Family History:  Family History  Problem Relation Age of Onset   Diabetes Mother    Hypertension Mother    Heart attack Father     Social History:  Social History   Substance and Sexual Activity  Alcohol Use Not Currently   Comment: 12 Beers / Weekly     Social History   Substance and Sexual Activity  Drug Use Yes   Types: Methamphetamines    Social History   Socioeconomic History   Marital status: Single    Spouse name: Not on file   Number of children: Not on file   Years of education: Not on file   Highest education level: Not on file  Occupational History   Not on file  Tobacco Use   Smoking status: Never   Smokeless tobacco: Current    Types: Chew   Tobacco comments:    1/2 Can chew Daily  Vaping Use   Vaping Use: Never used  Substance and Sexual Activity   Alcohol use: Not Currently    Comment: 12 Beers / Weekly   Drug use: Yes    Types: Methamphetamines   Sexual activity: Not on file  Other Topics  Concern   Not on file  Social History Narrative   Not on file   Social Determinants of Health   Financial Resource Strain: Not on file  Food Insecurity: Not on file  Transportation Needs: Not on file  Physical Activity: Not on file  Stress: Not on file  Social Connections: Not on file   Additional Social History:                         Sleep: Good  Appetite:  Good  Current Medications: Current Facility-Administered Medications  Medication Dose Route Frequency Provider Last Rate Last Admin   acetaminophen (TYLENOL) tablet 650 mg  650 mg Oral Q6H PRN [name], NP       alum & mag hydroxide-simeth (MAALOX/MYLANTA) 200-200-20 MG/5ML suspension 30 mL  30 mL Oral Q4H PRN [name], NP       benztropine (COGENTIN) tablet 1 mg  1 mg Oral Daily Waylan Boga Y, NP   1 mg at 05/16/21 1013   DULoxetine (CYMBALTA) DR capsule 30 mg  30 mg Oral Daily [name], NP   30 mg at 05/16/21 1013   haloperidol (HALDOL) tablet 5 mg  5 mg Oral BID [name], NP   5 mg at 05/16/21 1013  magnesium hydroxide (MILK OF MAGNESIA) suspension 30 mL  30 mL Oral Daily PRN [name], NP        Lab Results: No results found for this or any previous visit (from the past 48 hour(s)).  Blood Alcohol level:  Lab Results  Component Value Date   ETH <10 05/11/2021   ETH <10 34/74/2595    Metabolic Disorder Labs: Lab Results  Component Value Date   HGBA1C 5.6 09/10/2019   No results found for: PROLACTIN Lab Results  Component Value Date   CHOL 132 09/10/2019   TRIG 46 09/10/2019   HDL 49 09/10/2019   CHOLHDL 2.7 09/10/2019   LDLCALC 72 09/10/2019    Physical Findings: AIMS:  , ,  ,  ,    CIWA:    COWS:     Musculoskeletal: Strength & Muscle Tone: within normal limits Gait & Station: normal Patient leans: N/A  Psychiatric Specialty Exam:  Presentation  General Appearance: Appropriate for Environment  Eye Contact:Minimal  Speech:Blocked; Garbled;  Slurred  Speech Volume:Decreased  Handedness:Right   Mood and Affect  Mood:Labile  Affect:Congruent   Thought Process  Thought Processes:Linear  Descriptions of Associations:Circumstantial  Orientation:Partial  Thought Content:Paranoid Ideation; Scattered  History of Schizophrenia/Schizoaffective disorder:No  Duration of Psychotic Symptoms:Greater than six months  Hallucinations:No data recorded Ideas of Reference:None  Suicidal Thoughts:No data recorded Homicidal Thoughts:No data recorded  Sensorium  Memory:Immediate Poor; Recent Poor; Remote Poor  Judgment:Poor  Insight:Poor   Executive Functions  Concentration:Poor  Attention Span:Poor  Recall:Poor  Fund of Knowledge:Poor  Language:Poor   Psychomotor Activity  Psychomotor Activity:No data recorded  Assets  Assets:Communication Skills; Resilience; Social Support; Financial Resources/Insurance   Sleep  Sleep:No data recorded   Physical Exam: Physical Exam Vitals and nursing note reviewed.  Constitutional:      Appearance: Normal appearance. He is normal weight.  Neurological:     General: No focal deficit present.     Mental Status: He is alert and oriented to person, place, and time.  Psychiatric:        Attention and Perception: Attention and perception normal.        Mood and Affect: Mood is anxious and depressed.        Speech: Speech normal.        Behavior: Behavior normal. Behavior is cooperative.        Thought Content: Thought content is paranoid.        Cognition and Memory: Cognition and memory normal.        Judgment: Judgment normal.   Review of Systems  Constitutional: Negative.   HENT: Negative.    Eyes: Negative.   Respiratory: Negative.    Cardiovascular: Negative.   Gastrointestinal: Negative.   Genitourinary: Negative.   Musculoskeletal: Negative.   Skin: Negative.   Neurological: Negative.   Endo/Heme/Allergies: Negative.   Psychiatric/Behavioral:   Positive for substance abuse.   Blood pressure 129/89, pulse (!) 53, temperature 97.6 F (36.4 C), temperature source Oral, resp. rate 20, height 5\' 8"  (1.727 m), weight 75.5 kg, SpO2 98 %. Body mass index is 25.31 kg/m.   Treatment Plan Summary: Daily contact with patient to assess and evaluate symptoms and progress in treatment, Medication management, and Plan continue current medications.  Parks Ranger, DO 05/16/2021, 10:38 AM

## 2021-05-16 NOTE — Progress Notes (Signed)
Recreation Therapy Notes  INPATIENT RECREATION THERAPY ASSESSMENT  Patient Details Name: Nicholas Lindsey MRN: [id number] DOB: 1965/05/18 Today's Date: 05/16/2021       Information Obtained From: Patient  Able to Participate in Assessment/Interview: Yes  Patient Presentation: Responsive  Reason for Admission (Per Patient): Active Symptoms, Substance Abuse  Patient Stressors:    Coping Skills:   Isolation, Avoidance, Substance Abuse  Leisure Interests (2+):   (Nothing)  Frequency of Recreation/Participation:    Awareness of Community Resources:  Yes  Community Resources:  Church  Current Use: Yes  If no, Barriers?:    Expressed Interest in [place]: Yes  County of Residence:  Insurance under[place]  Patient Main Form of Transportation: Other (Comment) (scooter)  Patient Strengths:  easy to get along with  Patient Identified Areas of Improvement:  N/A  Patient Goal for Hospitalization:  I don't know  Current SI (including self-harm):  No  Current HI:  No  Current AVH: No  Staff Intervention Plan: Group Attendance, Collaborate with Interdisciplinary Treatment Team  Consent to Intern Participation: N/A  Ala Kratz 05/16/2021, 4:00 PM

## 2021-05-16 NOTE — Plan of Care (Addendum)
Patient presents A&O x 4. Patient affect is sad and depressed; behavior withdrawn and isolative.  Denies AVH, SI, HI, depression, anxiety or pain.  Reports sleeping well.  VSS.  Patient compliant with all scheduled meds.  Patient spent most of shift in room except for meals. Ongoing Q15 minute safety check rounds per unit protocol.  Problem: Education: Goal: Knowledge of [facility] Education information/materials will improve Outcome: Progressing Goal: Emotional status will improve Outcome: Progressing Goal: Mental status will improve Outcome: Progressing Goal: Verbalization of understanding the information provided will improve Outcome: Progressing   Problem: Activity: Goal: Sleeping patterns will improve Outcome: Progressing   Problem: Coping: Goal: Ability to verbalize frustrations and anger appropriately will improve Outcome: Progressing Goal: Ability to demonstrate self-control will improve Outcome: Progressing   Problem: Safety: Goal: Periods of time without injury will increase Outcome: Progressing   Problem: Activity: Goal: Will verbalize the importance of balancing activity with adequate rest periods Outcome: Progressing   Problem: Education: Goal: Will be free of psychotic symptoms Outcome: Progressing   Problem: Coping: Goal: Coping ability will improve Outcome: Progressing

## 2021-05-16 NOTE — Progress Notes (Signed)
Recreation Therapy Notes  Date: 05/16/2021  Time: 1:25 PM    Location: Courtyard     Behavioral response: N/A   Intervention Topic: Leisure   Discussion/Intervention: Patient refused to attend group.   Clinical Observations/Feedback:  Patient refused to attend group.   Kristapher [place] LRT/CTRS          Okema Rollinson 05/16/2021 3:41 PM

## 2021-05-16 NOTE — Progress Notes (Signed)
Patient compliant with medications. Denies SI/HI/A/VH and verbally contracted for safety. Patient endorsing Anxiety and Depression on 4.  No adverse drug noted. Support and encouragement provided.

## 2021-05-16 NOTE — BHH Counselor (Signed)
CSW spoke with pt regarding discharge plans. Pt stated he was interested in [facility] as he had been through their SUD treatment program in the past. CSW provided pt with phone number to [facility] and pt contacted the intake coordinator for the [facility]. Pt stated that the coordinator stated he could go back to the program.   Pt contacted his brother, Riaz Onorato, [phone],DTO said that pt could return home to pick up some clothes and drive pt to the program.   Pt stated he was interested in leaving tomorrow if possible.    Nicholas Lindsey, MSW, LCSW-A 2/6/20233:10 PM

## 2021-05-16 NOTE — BH IP Treatment Plan (Signed)
Interdisciplinary Treatment and Diagnostic Plan Update  05/16/2021 Time of Session: 10:00AM Nicholas Lindsey MRN: [id number]  Principal Diagnosis: Methamphetamine-induced psychotic disorder [facility])  Secondary Diagnoses: Principal Problem:   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[place])   Current Medications:  Current Facility-Administered Medications  Medication Dose Route Frequency Provider Last Rate Last Admin   acetaminophen (TYLENOL) tablet 650 mg  650 mg Oral Q6H PRN [name], NP       alum & mag hydroxide-simeth (MAALOX/MYLANTA) 200-200-20 MG/5ML suspension 30 mL  30 mL Oral Q4H PRN [name], NP       benztropine (COGENTIN) tablet 1 mg  1 mg Oral Daily [name], NP   1 mg at 05/16/21 1013   DULoxetine (CYMBALTA) DR capsule 30 mg  30 mg Oral Daily [name], NP   30 mg at 05/16/21 1013   haloperidol (HALDOL) tablet 5 mg  5 mg Oral BID [name], NP   5 mg at 05/16/21 1013   magnesium hydroxide (MILK OF MAGNESIA) suspension 30 mL  30 mL Oral Daily PRN [name], NP       PTA Medications: Medications Prior to Admission  Medication Sig Dispense Refill Last Dose   DULoxetine (CYMBALTA) 30 MG capsule Take 1 capsule (30 mg total) by mouth daily. 30 capsule 0    haloperidol (HALDOL) 5 MG tablet Take 1 tablet (5 mg total) by mouth 2 (two) times daily. 60 tablet 0     Patient Stressors: Marital or family conflict   Medication change or noncompliance    Patient Strengths: Hydrographic surveyor for treatment/growth  Physical Health   Treatment Modalities: Medication Management, Group therapy, Case management,  1 to 1 session with clinician, Psychoeducation, Recreational therapy.   Physician Treatment Plan for Primary Diagnosis: 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[place]) Long Term Goal(s): Improvement in symptoms so as ready for discharge   Short Term Goals: Ability to identify changes in lifestyle to reduce recurrence of  condition will improve Ability to verbalize feelings will improve Ability to disclose and discuss suicidal ideas Ability to demonstrate self-control will improve Ability to identify and develop effective coping behaviors will improve Ability to maintain clinical measurements within normal limits will improve Compliance with prescribed medications will improve Ability to identify triggers associated with substance abuse/mental health issues will improve  Medication Management: Evaluate patient's response, side effects, and tolerance of medication regimen.  Therapeutic Interventions: 1 to 1 sessions, Unit Group sessions and Medication administration.  Evaluation of Outcomes: Progressing  Physician Treatment Plan for Secondary Diagnosis: Principal Problem:   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[place])  Long Term Goal(s): Improvement in symptoms so as ready for discharge   Short Term Goals: Ability to identify changes in lifestyle to reduce recurrence of condition will improve Ability to verbalize feelings will improve Ability to disclose and discuss suicidal ideas Ability to demonstrate self-control will improve Ability to identify and develop effective coping behaviors will improve Ability to maintain clinical measurements within normal limits will improve Compliance with prescribed medications will improve Ability to identify triggers associated with substance abuse/mental health issues will improve     Medication Management: Evaluate patient's response, side effects, and tolerance of medication regimen.  Therapeutic Interventions: 1 to 1 sessions, Unit Group sessions and Medication administration.  Evaluation of Outcomes: Progressing   RN Treatment Plan for Primary Diagnosis: 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[place]) Long Term Goal(s): Knowledge of disease and therapeutic regimen to maintain health will improve  Short Term Goals: Ability to remain  free from injury  will improve, Ability to demonstrate self-control, Ability to verbalize feelings will improve, Ability to identify and develop effective coping behaviors will improve, and Compliance with prescribed medications will improve  Medication Management: RN will administer medications as ordered by provider, will assess and evaluate patient's response and provide education to patient for prescribed medication. RN will report any adverse and/or side effects to prescribing provider.  Therapeutic Interventions: 1 on 1 counseling sessions, Psychoeducation, Medication administration, Evaluate responses to treatment, Monitor vital signs and CBGs as ordered, Perform/monitor CIWA, COWS, AIMS and Fall Risk screenings as ordered, Perform wound care treatments as ordered.  Evaluation of Outcomes: Progressing   LCSW Treatment Plan for Primary Diagnosis: 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[place]) Long Term Goal(s): Safe transition to appropriate next level of care at discharge, Engage patient in therapeutic group addressing interpersonal concerns.  Short Term Goals: Engage patient in aftercare planning with referrals and resources, Increase social support, Increase ability to appropriately verbalize feelings, Increase emotional regulation, Facilitate patient progression through stages of change regarding substance use diagnoses and concerns, Identify triggers associated with mental health/substance abuse issues, and Increase skills for wellness and recovery  Therapeutic Interventions: Assess for all discharge needs, 1 to 1 time with Social worker, Explore available resources and support systems, Assess for adequacy in community support network, Educate family and significant other(s) on suicide prevention, Complete Psychosocial Assessment, Interpersonal group therapy.  Evaluation of Outcomes: Progressing   Progress in Treatment: Attending groups: No. Participating in groups: No. Taking medication as  prescribed: Yes. Toleration medication: Yes. Family/Significant other contact made: No, will contact:  pt declined permission Patient understands diagnosis: Yes. Discussing patient identified problems/goals with staff: Yes. Medical problems stabilized or resolved: Yes. Denies suicidal/homicidal ideation: Yes. Issues/concerns per patient self-inventory: No. Other: None  New problem(s) identified: No, Describe:  None  New Short Term/Long Term Goal(s): Patient to work towards detox, elimination of symptoms of psychosis, medication management for mood stabilization;  development of comprehensive mental wellness/sobriety plan.   Patient Goals:  "find treatment, not sure"  Discharge Plan or Barriers: CSW will assist pt with development of appropriate discharge/aftercare plan.   Reason for Continuation of Hospitalization: Delusions  Medication stabilization Withdrawal symptoms  Estimated Length of Stay: 1-7 days   Scribe for Treatment Team: Jazlyne Gauger A [place], [place] 05/16/2021 10:59 AM

## 2021-05-17 ENCOUNTER — Other Ambulatory Visit: Payer: Self-pay

## 2021-05-17 DIAGNOSIS — F15959 Other stimulant use, unspecified with stimulant-induced psychotic disorder, unspecified: Secondary | ICD-10-CM | POA: Diagnosis not present

## 2021-05-17 MED ORDER — BENZTROPINE MESYLATE 1 MG PO TABS
1.0000 mg | ORAL_TABLET | Freq: Every day | ORAL | 2 refills | Status: DC
  Filled 2021-05-17: qty 30, 30d supply, fill #0

## 2021-05-17 MED ORDER — DULOXETINE HCL 30 MG PO CPEP
30.0000 mg | ORAL_CAPSULE | Freq: Every day | ORAL | 2 refills | Status: DC
  Filled 2021-05-17: qty 30, 30d supply, fill #0

## 2021-05-17 MED ORDER — HALOPERIDOL 5 MG PO TABS
5.0000 mg | ORAL_TABLET | Freq: Two times a day (BID) | ORAL | 0 refills | Status: DC
  Filled 2021-05-17: qty 60, 30d supply, fill #0

## 2021-05-17 NOTE — Plan of Care (Signed)
Problem: Group Participation Goal: STG - Patient will engage in groups without prompting or encouragement from LRT x3 group sessions within 5 recreation therapy group sessions Description: STG - Patient will engage in groups without prompting or encouragement from LRT x3 group sessions within 5 recreation therapy group sessions 05/17/2021 1617 by Ernest Haber, LRT Outcome: Not Applicable 09/15/1592 [id number] by Ernest Haber, LRT Outcome: Not Met (add Reason) Note: Patient did not attend any groups.

## 2021-05-17 NOTE — Discharge Summary (Signed)
Physician Discharge Summary Note  Patient:  Nicholas Lindsey is an 56 y.o., male MRN:  [id number] DOB:  [birth year] Patient phone:  [phone] (home)  Patient address:   [place] [address],  Total Time spent with patient: 1 hour  Date of Admission:  05/14/2021 Date of Discharge: 05/17/2021  Reason for Admission:  See triage note for additional details, pt to ED for HI BIB BPD. Pt states his brothers are "out to get him" and are working with the [place]; pt also states his home is "bugged". Pt Homicidal to brothers and "the ones out to get me", denies SI.  Upon assessment pt yelling and pacing around room stating " I need a shot to help me because when I get like this I need a shot to help me calm down". Pt denies any current Auditory/ Visual hallucinations.   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[facility])   Past Psychiatric History: Substance Abuse and Paranoia  Past Medical History:  Past Medical History:  Diagnosis Date   History of kidney stones    Left breast abscess 2008   I&D required- [place], [place] (per patient)   Nodule of anterior chest wall 01/19/2017    Past Surgical History:  Procedure Laterality Date   ABCESS DRAINAGE Left 2008   Breast- [place], [place]   Family History:  Family History  Problem Relation Age of Onset   Diabetes Mother    Hypertension Mother    Heart attack Father     Social History:  Social History   Substance and Sexual Activity  Alcohol Use Not Currently   Comment: 12 Beers / Weekly     Social History   Substance and Sexual Activity  Drug Use Yes   Types: Methamphetamines    Social History   Socioeconomic History   Marital status: Single    Spouse name: Not on file   Number of children: Not on file   Years of education: Not on file   Highest education level: Not on file  Occupational History   Not on  file  Tobacco Use   Smoking status: Never   Smokeless tobacco: Current    Types: Chew   Tobacco comments:    1/2 Can chew Daily  Vaping Use   Vaping Use: Never used  Substance and Sexual Activity   Alcohol use: Not Currently    Comment: 12 Beers / Weekly   Drug use: Yes    Types: Methamphetamines   Sexual activity: Not on file  Other Topics Concern   Not on file  Social History Narrative   Not on file   Social Determinants of Health   Financial Resource Strain: Not on file  Food Insecurity: Not on file  Transportation Needs: Not on file  Physical Activity: Not on file  Stress: Not on file  Social Connections: Not on file    Hospital Course: Earle was voluntarily admitted under routine orders and precautions.  He had been using methamphetamines and became very paranoid.  He was having suicidal and homicidal ideation.  While on the unit he was started on Cymbalta and Haldol and did well.  His paranoia diminished.  He tolerated the medications without any problems.  There is no evidence of EPS or TD.  He stated that there was a house in [place] where he could board and work and stay off drugs.  I believe he called it the [place].  He asked to be discharged to the care of his brother so that they could go to [place].  On the day of discharge she denied suicidal ideation homicidal ideation auditory or visual hallucinations.  His judgment and insight were good.  Physical Findings: AIMS:  , ,  ,  ,    CIWA:    COWS:     Musculoskeletal: Strength & Muscle Tone: within normal limits Gait & Station: normal Patient leans: N/A   Psychiatric Specialty Exam:  Presentation  General Appearance: Appropriate for Environment  Eye Contact:Minimal  Speech:Blocked; Garbled; Slurred  Speech Volume:Decreased  Handedness:Right   Mood and Affect  Mood:Labile  Affect:Congruent   Thought Process  Thought Processes:Linear  Descriptions of  Associations:Circumstantial  Orientation:Partial  Thought Content:Paranoid Ideation; Scattered  History of Schizophrenia/Schizoaffective disorder:No  Duration of Psychotic Symptoms:Greater than six months  Hallucinations:No data recorded Ideas of Reference:None  Suicidal Thoughts:No data recorded Homicidal Thoughts:No data recorded  Sensorium  Memory:Immediate Poor; Recent Poor; Remote Poor  Judgment:Poor  Insight:Poor   Executive Functions  Concentration:Poor  Attention Span:Poor  Recall:Poor  Fund of Knowledge:Poor  Language:Poor   Psychomotor Activity  Psychomotor Activity:No data recorded  Assets  Assets:Communication Skills; Resilience; Social Support; Financial Resources/Insurance   Sleep  Sleep:No data recorded   Physical Exam: Physical Exam Vitals and nursing note reviewed.  Constitutional:      Appearance: Normal appearance. He is normal weight.  Neurological:     General: No focal deficit present.     Mental Status: He is alert and oriented to person, place, and time.  Psychiatric:        Attention and Perception: Attention and perception normal.        Mood and Affect: Mood and affect normal.        Speech: Speech normal.        Behavior: Behavior is uncooperative.        Thought Content: Thought content normal.        Cognition and Memory: Cognition and memory normal.        Judgment: Judgment normal.   Review of Systems  Constitutional: Negative.   HENT: Negative.    Eyes: Negative.   Respiratory: Negative.    Cardiovascular: Negative.   Gastrointestinal: Negative.   Genitourinary: Negative.   Musculoskeletal: Negative.   Skin: Negative.   Neurological: Negative.   Endo/Heme/Allergies: Negative.   Psychiatric/Behavioral: Negative.    Blood pressure 101/76, pulse (!) 54, temperature 97.6 F (36.4 C), temperature source Oral, resp. rate 18, height 5\' 8"  (1.727 m), weight 75.5 kg, SpO2 97 %. Body mass index is 25.31  kg/m.   Social History   Tobacco Use  Smoking Status Never  Smokeless Tobacco Current   Types: Chew  Tobacco Comments   1/2 Can chew Daily   Tobacco Cessation:  A prescription for an FDA-approved tobacco cessation medication was offered at discharge and the patient refused   Blood Alcohol level:  Lab Results  Component Value Date   [facility] <10 05/11/2021   ETH <10 69/67/8938    Metabolic Disorder Labs:  Lab Results  Component Value Date   HGBA1C 5.6 09/10/2019   No results found for: PROLACTIN Lab Results  Component Value Date   CHOL 132 09/10/2019   TRIG 46 09/10/2019   HDL 49 09/10/2019   CHOLHDL 2.7 09/10/2019   [place] 72 09/10/2019    See Psychiatric Specialty Exam and Suicide Risk Assessment completed by Attending Physician prior to discharge.  Discharge destination:  Other:  [facility]  Is patient on multiple antipsychotic therapies at discharge:  No   Has Patient had three or more failed trials of antipsychotic monotherapy by history:  No  Recommended Plan for Multiple Antipsychotic Therapies: NA   Allergies as of 05/17/2021   No Known Allergies      Medication List     TAKE these medications      Indication  benztropine 1 MG tablet Commonly known as: COGENTIN Take 1 tablet (1 mg total) by mouth daily. Start taking on: May 18, 2021    DULoxetine 30 MG capsule Commonly known as: Cymbalta Take 1 capsule (30 mg total) by mouth daily. Start taking on: May 18, 2021    haloperidol 5 MG tablet Commonly known as: HALDOL Take 1 tablet (5 mg total) by mouth 2 (two) times daily.          Follow-up recommendations:  See SW note    Signed: [place], DO 05/17/2021, 10:02 AM

## 2021-05-17 NOTE — Progress Notes (Signed)
Pt denies SI, HI, and AVH. Patient was educated on prescriptions, follow up care, when to call for help/suicide hotline. Pt questions were answered and pt verbalized understanding and did not voice any concerns. Pt's belongings were returned and belongings sheet was signed. Pt was safely discharged to the [facility].

## 2021-05-17 NOTE — BHH Suicide Risk Assessment (Signed)
[facility] Discharge Suicide Risk Assessment   Principal Problem: Methamphetamine-induced psychotic disorder [facility]) Discharge Diagnoses: Principal Problem:   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[place])   Total Time spent with patient: 1 hour  Musculoskeletal: Strength & Muscle Tone: within normal limits Gait & Station: normal Patient leans: N/A  Psychiatric Specialty Exam  Presentation  General Appearance: Appropriate for Environment  Eye Contact:Minimal  Speech:Blocked; Garbled; Slurred  Speech Volume:Decreased  Handedness:Right   Mood and Affect  Mood:Labile  Duration of Depression Symptoms: No data recorded Affect:Congruent   Thought Process  Thought Processes:Linear  Descriptions of Associations:Circumstantial  Orientation:Partial  Thought Content:Paranoid Ideation; Scattered  History of Schizophrenia/Schizoaffective disorder:No  Duration of Psychotic Symptoms:Greater than six months  Hallucinations:No data recorded Ideas of Reference:None  Suicidal Thoughts:No data recorded Homicidal Thoughts:No data recorded  Sensorium  Memory:Immediate Poor; Recent Poor; Remote Poor  Judgment:Poor  Insight:Poor   Executive Functions  Concentration:Poor  Attention Span:Poor  Recall:Poor  Fund of Knowledge:Poor  Language:Poor   Psychomotor Activity  Psychomotor Activity:No data recorded  Assets  Assets:Communication Skills; Resilience; Social Support; Financial Resources/Insurance   Sleep  Sleep:No data recorded  Physical Exam: Physical Exam Vitals and nursing note reviewed.  Constitutional:      Appearance: Normal appearance. He is normal weight.  Neurological:     General: No focal deficit present.     Mental Status: He is alert and oriented to person, place, and time.  Psychiatric:        Attention and Perception: Attention and perception normal.        Mood and Affect: Mood and affect normal.        Speech: Speech normal.         Behavior: Behavior normal. Behavior is cooperative.        Thought Content: Thought content normal.        Cognition and Memory: Cognition and memory normal.        Judgment: Judgment normal.   Review of Systems  Constitutional: Negative.   HENT: Negative.    Eyes: Negative.   Respiratory: Negative.    Cardiovascular: Negative.   Gastrointestinal: Negative.   Genitourinary: Negative.   Musculoskeletal: Negative.   Skin: Negative.   Neurological: Negative.   Endo/Heme/Allergies: Negative.   Psychiatric/Behavioral: Negative.    Blood pressure 101/76, pulse (!) 54, temperature 97.6 F (36.4 C), temperature source Oral, resp. rate 18, height 5\' 8"  (1.727 m), weight 75.5 kg, SpO2 97 %. Body mass index is 25.31 kg/m.  Mental Status Per Nursing Assessment::   On Admission:  NA  Demographic Factors:  Male, Caucasian, and Unemployed  Loss Factors: Decrease in vocational status  Historical Factors: NA  Risk Reduction Factors:   NA  Continued Clinical Symptoms:  Depression:   Comorbid alcohol abuse/dependence  Cognitive Features That Contribute To Risk:  None    Suicide Risk:  Minimal: No identifiable suicidal ideation.  Patients presenting with no risk factors but with morbid ruminations; may be classified as minimal risk based on the severity of the depressive symptoms    Plan Of Care/Follow-up recommendations: [place], DO 05/17/2021, 9:44 AM

## 2021-05-17 NOTE — Progress Notes (Signed)
°  [facility] Adult Case Management Discharge Plan :  Will you be returning to the same living situation after discharge:  No. At discharge, do you have transportation home?: Yes,  pt's brother is providing transportation Do you have the ability to pay for your medications: No.  Release of information consent forms completed and in the chart;  Patient's signature needed at discharge.  Patient to Follow up at:  [facility]   Why: Please attend walk in hours at 8:30am-2pm Monday- Friday to schedule your appointment. Thanks! Contact information: [address]  [place], [place] [address]  Phone: [phone]                Next level of care provider has access to [place] and Suicide Prevention discussed: Yes,  completed with pt     Has patient been referred to the Quitline?: Patient refused referral  Patient has been referred for addiction treatment: Pt. refused referral  Zuzanna Maroney A [place], [place] 05/17/2021, 10:32 AM

## 2021-05-17 NOTE — Progress Notes (Signed)
Recreation Therapy Notes  INPATIENT RECREATION TR PLAN  Patient Details Name: CARDALE DORER MRN: [id number] DOB: 1965/08/21 Today's Date: 05/17/2021  Rec Therapy Plan Is patient appropriate for Therapeutic Recreation?: Yes Treatment times per week: at least 3 Estimated Length of Stay: 5-7 days TR Treatment/Interventions: Group participation (Comment)  Discharge Criteria Pt will be discharged from therapy if:: Discharged Treatment plan/goals/alternatives discussed and agreed upon by:: Patient/family  Discharge Summary Short term goals set: Patient will engage in groups without prompting or encouragement from LRT x3 group sessions within 5 recreation therapy group sessions Short term goals met: Not met Reason goals not met: Patient did not attend any groups Therapeutic equipment acquired: N/A Reason patient discharged from therapy: Discharge from hospital Pt/family agrees with progress & goals achieved: Yes Date patient discharged from therapy: 05/17/21   Makenzy Krist 05/17/2021, 4:18 PM

## 2021-05-17 NOTE — Plan of Care (Signed)
Pt visible in the milieu this shift. Pt cooperative and denies SI/HI/AVH. Pt remains med compliant. Pt also socializes and engages with peers in the break room. Q15 min safety checks maintained. Pt resting in bed quietly.  Problem: Education: Goal: Knowledge of [facility] Education information/materials will improve Outcome: Progressing Goal: Emotional status will improve Outcome: Progressing Goal: Mental status will improve Outcome: Progressing Goal: Verbalization of understanding the information provided will improve Outcome: Progressing   Problem: Activity: Goal: Interest or engagement in activities will improve Outcome: Progressing Goal: Sleeping patterns will improve Outcome: Progressing   Problem: Coping: Goal: Ability to verbalize frustrations and anger appropriately will improve Outcome: Progressing Goal: Ability to demonstrate self-control will improve Outcome: Progressing   Problem: Health Behavior/Discharge Planning: Goal: Identification of resources available to assist in meeting health care needs will improve Outcome: Progressing Goal: Compliance with treatment plan for underlying cause of condition will improve Outcome: Progressing   Problem: Physical Regulation: Goal: Ability to maintain clinical measurements within normal limits will improve Outcome: Progressing   Problem: Safety: Goal: Periods of time without injury will increase Outcome: Progressing   Problem: Activity: Goal: Will verbalize the importance of balancing activity with adequate rest periods Outcome: Progressing   Problem: Education: Goal: Will be free of psychotic symptoms Outcome: Progressing Goal: Knowledge of the prescribed therapeutic regimen will improve Outcome: Progressing   Problem: Coping: Goal: Coping ability will improve Outcome: Progressing Goal: Will verbalize feelings Outcome: Progressing   Problem: Health Behavior/Discharge Planning: Goal: Compliance with  prescribed medication regimen will improve Outcome: Progressing   Problem: Nutritional: Goal: Ability to achieve adequate nutritional intake will improve Outcome: Progressing   Problem: Role Relationship: Goal: Ability to communicate needs accurately will improve Outcome: Progressing Goal: Ability to interact with others will improve Outcome: Progressing   Problem: Safety: Goal: Ability to redirect hostility and anger into socially appropriate behaviors will improve Outcome: Progressing Goal: Ability to remain free from injury will improve Outcome: Progressing   Problem: Self-Care: Goal: Ability to participate in self-care as condition permits will improve Outcome: Progressing   Problem: Self-Concept: Goal: Will verbalize positive feelings about self Outcome: Progressing

## 2021-06-04 ENCOUNTER — Other Ambulatory Visit: Payer: Self-pay

## 2021-06-04 ENCOUNTER — Emergency Department
Admission: EM | Admit: 2021-06-04 | Discharge: 2021-06-04 | Disposition: A | Discharge status: Home / Self Care | Payer: 59 | Attending: Student in an Organized Health Care Education/Training Program | Admitting: Student in an Organized Health Care Education/Training Program

## 2021-06-04 DIAGNOSIS — F1520 Other stimulant dependence, uncomplicated: Secondary | ICD-10-CM | POA: Diagnosis not present

## 2021-06-04 DIAGNOSIS — F1510 Other stimulant abuse, uncomplicated: Secondary | ICD-10-CM | POA: Diagnosis present

## 2021-06-04 DIAGNOSIS — F17220 Nicotine dependence, chewing tobacco, uncomplicated: Secondary | ICD-10-CM | POA: Insufficient documentation

## 2021-06-04 DIAGNOSIS — Y900 Blood alcohol level of less than 20 mg/100 ml: Secondary | ICD-10-CM | POA: Insufficient documentation

## 2021-06-04 DIAGNOSIS — F22 Delusional disorders: Secondary | ICD-10-CM

## 2021-06-04 LAB — COMPREHENSIVE METABOLIC PANEL
ALT: 51 U/L — ABNORMAL HIGH (ref 0–44)
AST: 41 U/L (ref 15–41)
Albumin: 3.9 g/dL (ref 3.5–5.0)
Alkaline Phosphatase: 50 U/L (ref 38–126)
Anion gap: 9 (ref 5–15)
BUN: 10 mg/dL (ref 6–20)
CO2: 21 mmol/L — ABNORMAL LOW (ref 22–32)
Calcium: 8.8 mg/dL — ABNORMAL LOW (ref 8.9–10.3)
Chloride: 105 mmol/L (ref 98–111)
Creatinine, Ser: 0.66 mg/dL (ref 0.61–1.24)
GFR, Estimated: >60 mL/min (ref >60)
Glucose, Bld: 98 mg/dL (ref 70–99)
Potassium: 3.1 mmol/L — ABNORMAL LOW (ref 3.5–5.1)
Sodium: 135 mmol/L (ref 135–145)
Total Bilirubin: 0.8 mg/dL (ref 0.3–1.2)
Total Protein: 7.3 g/dL (ref 6.5–8.1)

## 2021-06-04 LAB — CBC
HCT: 41.1 % (ref 39.0–52.0)
Hemoglobin: 14.3 g/dL (ref 13.0–17.0)
MCH: 30.8 pg (ref 26.0–34.0)
MCHC: 34.8 g/dL (ref 30.0–36.0)
MCV: 88.4 fL (ref 80.0–100.0)
Platelets: 198 10*3/uL (ref 150–400)
RBC: 4.65 MIL/uL (ref 4.22–5.81)
RDW: 13.1 % (ref 11.5–15.5)
WBC: 5.9 10*3/uL (ref 4.0–10.5)
nRBC: 0 % (ref 0.0–0.2)

## 2021-06-04 LAB — ACETAMINOPHEN LEVEL: Acetaminophen (Tylenol), Serum: <10 ug/mL — ABNORMAL LOW (ref 10–30)

## 2021-06-04 LAB — SALICYLATE LEVEL: Salicylate Lvl: <7 mg/dL — ABNORMAL LOW (ref 7.0–30.0)

## 2021-06-04 LAB — ETHANOL: Alcohol, Ethyl (B): <10 mg/dL (ref <10)

## 2021-06-04 MED ORDER — ONDANSETRON HCL 4 MG/2ML IJ SOLN: 4.0000 mg | Freq: Once | INTRAMUSCULAR | Status: DC

## 2021-06-04 MED ORDER — HALOPERIDOL 5 MG PO TABS
5.0000 mg | ORAL_TABLET | Freq: Two times a day (BID) | ORAL | Status: DC
  Administered 2021-06-04: 5 mg via ORAL
  Filled 2021-06-04: qty 1

## 2021-06-04 MED ORDER — BENZTROPINE MESYLATE 1 MG PO TABS: 1.0000 mg | ORAL_TABLET | Freq: Every day | ORAL | Status: DC

## 2021-06-04 NOTE — ED Provider Notes (Signed)
[facility] Provider Note    Event Date/Time   First MD Initiated Contact with Patient 06/04/21 [phone]     (approximate)   History   Psychiatric Evaluation   HPI  Nicholas Lindsey is a 56 y.o. male with a history of bipolar disorder as well as methamphetamine use presents to the ER for evaluation of paranoia past several days stating that the [employer] is building his house and or planting microchips in his veins.  Patient states he is having thoughts like this off and on for the past year and a half.  He denies any pain.  Patient appears disheveled.  Very odd     Physical Exam   Triage Vital Signs: ED Triage Vitals  Enc Vitals Group     BP 06/04/21 0900 (!) 134/103     Pulse Rate 06/04/21 0900 66     Resp 06/04/21 0900 16     Temp 06/04/21 0900 98.6 F (37 C)     Temp Source 06/04/21 0900 Oral     SpO2 06/04/21 0900 99 %     Weight 06/04/21 0901 175 lb (79.4 kg)     Height 06/04/21 0901 5\' 7"  (1.702 m)     Head Circumference --      Peak Flow --      Pain Score 06/04/21 0901 0     Pain Loc --      Pain Edu? --      Excl. in [place]? --     Most recent vital signs: Vitals:   06/04/21 0900  BP: (!) 134/103  Pulse: 66  Resp: 16  Temp: 98.6 F (37 C)  SpO2: 99%     Constitutional: Alert  Eyes: Conjunctivae are normal.  Head: Atraumatic. Nose: No congestion/rhinnorhea. Mouth/Throat: Mucous membranes are moist.   Neck: Painless ROM.  Cardiovascular:   Good peripheral circulation. Respiratory: Normal respiratory effort.  No retractions.  Gastrointestinal: Soft and nontender.  Musculoskeletal:  no deformity Neurologic:  MAE spontaneously. No gross focal neurologic deficits are appreciated.  Skin:  Skin is warm, dry and intact. No rash noted. Psychiatric: odd, disheveled, acutely paranoid    ED Results / Procedures / Treatments   Labs (all labs ordered are listed, but only abnormal results are displayed) Labs Reviewed  RESP PANEL BY RT-PCR  (FLU A&B, COVID) ARPGX2  CBC  COMPREHENSIVE METABOLIC PANEL  ETHANOL  SALICYLATE LEVEL  ACETAMINOPHEN LEVEL  URINE DRUG SCREEN, QUALITATIVE ([place])     EKG     RADIOLOGY    PROCEDURES:  Critical Care performed:   Procedures   MEDICATIONS ORDERED IN ED: Medications  haloperidol (HALDOL) tablet 5 mg (has no administration in time range)     IMPRESSION / MDM / [place] / ED COURSE  I reviewed the triage vital signs and the nursing notes.                              Differential diagnosis includes, but is not limited to, Psychosis, delirium, medication effect, noncompliance, polysubstance abuse, Si, Hi, depression  Patient here for evaluation of paranoia.  Patient has psych history of bipolar.  Laboratory testing was ordered to evaluation for underlying electrolyte derangement or signs of underlying organic pathology to explain today's presentation.  Based on history and physical and laboratory evaluation, it appears that the patient's presentation is 2/2 underlying psychiatric disorder and will require further evaluation and management by  inpatient psychiatry.  Patient was  made an IVC due to odd behavior with paranoia and delusions..  Disposition pending psychiatric evaluation.  The patient has been placed in psychiatric observation due to the need to provide a safe environment for the patient while obtaining psychiatric consultation and evaluation, as well as ongoing medical and medication management to treat the patient's condition.  The patient has been placed under full IVC at this time.        FINAL CLINICAL IMPRESSION(S) / ED DIAGNOSES   Final diagnoses:  Paranoia ([place])     Rx / DC Orders   ED Discharge Orders     None        Note:  This document was prepared using Dragon voice recognition software and may include unintentional dictation errors.    Merlyn Lot, MD 06/04/21 [phone]

## 2021-06-04 NOTE — ED Triage Notes (Signed)
Pt comes into the ED via EMS from home, pt states "the FBI has planted bugs in my house and I was trying to get ride of them and it feeling like the mico chips got under my skin and in my lungs". Pt states he is living with his brother, when asked if he feels says "no, they have me under servalence ". Pt has flight of idea, pt is calm and cooperative on arrival

## 2021-06-04 NOTE — Discharge Instructions (Signed)
Please follow up with [facility].  Return for any additional questions or concerns.

## 2021-06-04 NOTE — Consult Note (Cosign Needed)
[facility] Psychiatry Consult   Reason for Consult:  Meth abuse with delusions Referring Physician:  EDP Patient Identification: Nicholas Lindsey MRN:  [id number] Principal Diagnosis: Methamphetamine use disorder, severe, dependence ([place]) Diagnosis:  Principal Problem:   Methamphetamine use disorder, severe, dependence ([place]) Active Problems:   Methamphetamine abuse ([place])   Total Time spent with patient: 45 minutes  Subjective:   Nicholas Lindsey is a 56 y.o. male patient admitted with meth abuse and delusions.  HPI:  56 yo male presented to the ED after using meth and convinced he has microchips planted by the [facility], which are delusions he has had for awhile.  Discharged from the inpatient unit on 2/7 and has been using meth all week, per the client.  He states, "I'm fine.  I just to the hospital to make sure I'm ok as he put his hand over his heart."  He is medically cleared with no suicidal/homicidal ideations, hallucinations, or withdrawal symptoms.  Nicholas Lindsey ate his breakfast and is sleeping soundly now.  Pleasantly delusional regarding the chip planted inside him, no safety concerns.  His brother, Nicholas Lindsey, contacted and had no safety concerns.  Substance abuse resources provided.    Past Psychiatric History: meth abuse  Risk to Self:  none Risk to Others:  none Prior Inpatient Therapy:  2/7 discharge Prior Outpatient Therapy:  [facility]  Past Medical History:  Past Medical History:  Diagnosis Date   History of kidney stones    Left breast abscess 2008   I&D required- [place], [place] (per patient)   Nodule of anterior chest wall 01/19/2017    Past Surgical History:  Procedure Laterality Date   ABCESS DRAINAGE Left 2008   Breast- [place], [place]   Family History:  Family History  Problem Relation Age of Onset   Diabetes Mother    Hypertension Mother    Heart attack Father    Family Psychiatric  History: none Social History:  Social History   Substance and Sexual Activity   Alcohol Use Not Currently   Comment: 12 Beers / Weekly     Social History   Substance and Sexual Activity  Drug Use Yes   Types: Methamphetamines    Social History   Socioeconomic History   Marital status: Single    Spouse name: Not on file   Number of children: Not on file   Years of education: Not on file   Highest education level: Not on file  Occupational History   Not on file  Tobacco Use   Smoking status: Never   Smokeless tobacco: Current    Types: Chew   Tobacco comments:    1/2 Can chew Daily  Vaping Use   Vaping Use: Never used  Substance and Sexual Activity   Alcohol use: Not Currently    Comment: 12 Beers / Weekly   Drug use: Yes    Types: Methamphetamines   Sexual activity: Not on file  Other Topics Concern   Not on file  Social History Narrative   Not on file   Social Determinants of Health   Financial Resource Strain: Not on file  Food Insecurity: Not on file  Transportation Needs: Not on file  Physical Activity: Not on file  Stress: Not on file  Social Connections: Not on file   Additional Social History:    Allergies:  No Known Allergies  Labs:  Results for orders placed or performed during the hospital encounter of 06/04/21 (from the past 48 hour(s))  Comprehensive metabolic  panel     Status: Abnormal   Collection Time: 06/04/21  9:05 AM  Result Value Ref Range   Sodium 135 135 - 145 mmol/L   Potassium 3.1 (L) 3.5 - 5.1 mmol/L   Chloride 105 98 - 111 mmol/L   CO2 21 (L) 22 - 32 mmol/L   Glucose, Bld 98 70 - 99 mg/dL    Comment: Glucose reference range applies only to samples taken after fasting for at least 8 hours.   BUN 10 6 - 20 mg/dL   Creatinine, Ser 0.66 0.61 - 1.24 mg/dL   Calcium 8.8 (L) 8.9 - 10.3 mg/dL   Total Protein 7.3 6.5 - 8.1 g/dL   Albumin 3.9 3.5 - 5.0 g/dL   AST 41 15 - 41 U/L   ALT 51 (H) 0 - 44 U/L   Alkaline Phosphatase 50 38 - 126 U/L   Total Bilirubin 0.8 0.3 - 1.2 mg/dL   GFR, Estimated >60 >60 mL/min     Comment: (NOTE) Calculated using the CKD-EPI Creatinine Equation (2021)    Anion gap 9 5 - 15    Comment: Performed at [facility], [address] [place], [place] [address]  Ethanol     Status: None   Collection Time: 06/04/21  9:05 AM  Result Value Ref Range   Alcohol, Ethyl (B) <10 <10 mg/dL    Comment: (NOTE) Lowest detectable limit for serum alcohol is 10 mg/dL.  For medical purposes only. Performed at [facility], [address] [place], [place] [address]   Salicylate level     Status: Abnormal   Collection Time: 06/04/21  9:05 AM  Result Value Ref Range   Salicylate Lvl <8.2 (L) 7.0 - 30.0 mg/dL    Comment: Performed at [facility], [address] [place], [place] [address]  Acetaminophen level     Status: Abnormal   Collection Time: 06/04/21  9:05 AM  Result Value Ref Range   Acetaminophen (Tylenol), Serum <10 (L) 10 - 30 ug/mL    Comment: (NOTE) Therapeutic concentrations vary significantly. A range of 10-30 ug/mL  may be an effective concentration for many patients. However, some  are best treated at concentrations outside of this range. Acetaminophen concentrations >150 ug/mL at 4 hours after ingestion  and >50 ug/mL at 12 hours after ingestion are often associated with  toxic reactions.  Performed at [facility], [address] [place], [place] [address]   cbc     Status: None   Collection Time: 06/04/21  9:05 AM  Result Value Ref Range   WBC 5.9 4.0 - 10.5 K/uL   RBC 4.65 4.22 - 5.81 MIL/uL   Hemoglobin 14.3 13.0 - 17.0 g/dL   HCT 41.1 39.0 - 52.0 %   MCV 88.4 80.0 - 100.0 fL   MCH 30.8 26.0 - 34.0 pg   MCHC 34.8 30.0 - 36.0 g/dL   RDW 13.1 11.5 - 15.5 %   Platelets 198 150 - 400 K/uL   nRBC 0.0 0.0 - 0.2 %    Comment: Performed at [facility], [address] [place], [place] [address]    Current Facility-Administered Medications  Medication Dose Route Frequency Provider Last Rate Last  Admin   benztropine (COGENTIN) tablet 1 mg  1 mg Oral Daily [name], NP       haloperidol (HALDOL) tablet 5 mg  5 mg Oral BID Merlyn Lot, MD   5 mg at 06/04/21 1751   Current  Outpatient Medications  Medication Sig Dispense Refill   benztropine (COGENTIN) 1 MG tablet Take 1 tablet (1 mg total) by mouth once daily. (Patient not taking: Reported on 06/04/2021) 30 tablet 2   DULoxetine (CYMBALTA) 30 MG capsule Take 1 capsule (30 mg total) by mouth once daily. (Patient not taking: Reported on 06/04/2021) 30 capsule 2   haloperidol (HALDOL) 5 MG tablet Take 1 tablet (5 mg total) by mouth 2 (two) times daily. (Patient not taking: Reported on 06/04/2021) 60 tablet 0    Musculoskeletal: Strength & Muscle Tone: within normal limits Gait & Station: normal Patient leans: N/A  Psychiatric Specialty Exam: Physical Exam Vitals and nursing note reviewed.  Constitutional:      Appearance: Normal appearance.  HENT:     Head: Normocephalic.     Nose: Nose normal.  Pulmonary:     Effort: Pulmonary effort is normal.  Musculoskeletal:        General: Normal range of motion.     Cervical back: Normal range of motion.  Neurological:     General: No focal deficit present.     Mental Status: He is alert and oriented to person, place, and time.  Psychiatric:        Attention and Perception: Attention and perception normal.        Mood and Affect: Mood and affect normal.        Speech: Speech normal.        Behavior: Behavior normal. Behavior is cooperative.        Thought Content: Thought content is delusional.        Cognition and Memory: Cognition and memory normal.        Judgment: Judgment normal.    Review of Systems  Psychiatric/Behavioral:  Positive for substance abuse.   All other systems reviewed and are negative.  Blood pressure (!) 134/103, pulse 66, temperature 98.6 F (37 C), temperature source Oral, resp. rate 16, height 5\' 7"  (1.702 m), weight 79.4 kg, SpO2 99 %.Body mass  index is 27.41 kg/m.  General Appearance: Casual  Eye Contact:  Good  Speech:  Normal Rate  Volume:  Normal  Mood:  Anxious  Affect:  Congruent  Thought Process:  Coherent and Descriptions of Associations: Intact  Orientation:  Full (Time, Place, and Person)  Thought Content:  WDL and Logical  Suicidal Thoughts:  No  Homicidal Thoughts:  No  Memory:  Immediate;   Good Recent;   Good Remote;   Good  Judgement:  Fair  Insight:  Fair  Psychomotor Activity:  Normal  Concentration:  Concentration: Good and Attention Span: Good  Recall:  Good  Fund of Knowledge:  Fair  Language:  Good  Akathisia:  No  Handed:  Right  AIMS (if indicated):     Assets:  Housing Physical Health Resilience Social Support  ADL's:  Intact  Cognition:  WNL  Sleep:        Physical Exam: Physical Exam Vitals and nursing note reviewed.  Constitutional:      Appearance: Normal appearance.  HENT:     Head: Normocephalic.     Nose: Nose normal.  Pulmonary:     Effort: Pulmonary effort is normal.  Musculoskeletal:        General: Normal range of motion.     Cervical back: Normal range of motion.  Neurological:     General: No focal deficit present.     Mental Status: He is alert and oriented to person, place, and time.  Psychiatric:        Attention and Perception: Attention and perception normal.        Mood and Affect: Mood and affect normal.        Speech: Speech normal.        Behavior: Behavior normal. Behavior is cooperative.        Thought Content: Thought content is delusional.        Cognition and Memory: Cognition and memory normal.        Judgment: Judgment normal.   Review of Systems  Psychiatric/Behavioral:  Positive for substance abuse.   All other systems reviewed and are negative. Blood pressure (!) 134/103, pulse 66, temperature 98.6 F (37 C), temperature source Oral, resp. rate 16, height 5\' 7"  (1.702 m), weight 79.4 kg, SpO2 99 %. Body mass index is 27.41  kg/m.  Treatment Plan Summary: Methamphetamine abuse: -Continue Haldol 5 mg BID [facility] resources  Cogentin: -Started Cogentin 1 mg daily  Disposition: No evidence of imminent risk to self or others at present.    Waylan Boga, NP 06/04/2021 10:57 AM

## 2021-06-04 NOTE — ED Provider Notes (Signed)
The patient has been evaluated at bedside by Reita Cliche NP, psychiatry.  Patient is clinically stable.  Not felt to be a danger to self or others.  No SI or Hi.  No indication for inpatient psychiatric admission at this time.  Appropriate for continued outpatient therapy.    Merlyn Lot, MD 06/04/21 1128

## 2021-06-16 ENCOUNTER — Other Ambulatory Visit: Payer: Self-pay

## 2021-06-21 ENCOUNTER — Other Ambulatory Visit: Payer: Self-pay

## 2021-06-22 ENCOUNTER — Other Ambulatory Visit: Payer: Self-pay

## 2021-07-02 ENCOUNTER — Other Ambulatory Visit: Payer: Self-pay

## 2021-07-02 ENCOUNTER — Emergency Department
Admission: EM | Admit: 2021-07-02 | Discharge: 2021-07-02 | Discharge status: Against Medical Advice | Payer: Self-pay | Attending: Emergency Medicine | Admitting: Emergency Medicine

## 2021-07-02 DIAGNOSIS — Z79899 Other long term (current) drug therapy: Secondary | ICD-10-CM | POA: Insufficient documentation

## 2021-07-02 DIAGNOSIS — F99 Mental disorder, not otherwise specified: Secondary | ICD-10-CM

## 2021-07-02 DIAGNOSIS — Z046 Encounter for general psychiatric examination, requested by authority: Secondary | ICD-10-CM | POA: Insufficient documentation

## 2021-07-02 DIAGNOSIS — Z5329 Procedure and treatment not carried out because of patient's decision for other reasons: Secondary | ICD-10-CM | POA: Insufficient documentation

## 2021-07-02 DIAGNOSIS — Y900 Blood alcohol level of less than 20 mg/100 ml: Secondary | ICD-10-CM | POA: Insufficient documentation

## 2021-07-02 DIAGNOSIS — H938X9 Other specified disorders of ear, unspecified ear: Secondary | ICD-10-CM | POA: Insufficient documentation

## 2021-07-02 LAB — COMPREHENSIVE METABOLIC PANEL
ALT: 62 U/L — ABNORMAL HIGH (ref 0–44)
AST: 43 U/L — ABNORMAL HIGH (ref 15–41)
Albumin: 4.2 g/dL (ref 3.5–5.0)
Alkaline Phosphatase: 54 U/L (ref 38–126)
Anion gap: 9 (ref 5–15)
BUN: 17 mg/dL (ref 6–20)
CO2: 23 mmol/L (ref 22–32)
Calcium: 9.3 mg/dL (ref 8.9–10.3)
Chloride: 106 mmol/L (ref 98–111)
Creatinine, Ser: 0.8 mg/dL (ref 0.61–1.24)
GFR, Estimated: >60 mL/min (ref >60)
Glucose, Bld: 97 mg/dL (ref 70–99)
Potassium: 3.9 mmol/L (ref 3.5–5.1)
Sodium: 138 mmol/L (ref 135–145)
Total Bilirubin: 1.3 mg/dL — ABNORMAL HIGH (ref 0.3–1.2)
Total Protein: 8.3 g/dL — ABNORMAL HIGH (ref 6.5–8.1)

## 2021-07-02 LAB — CBC
HCT: 47.6 % (ref 39.0–52.0)
Hemoglobin: 16.1 g/dL (ref 13.0–17.0)
MCH: 30.4 pg (ref 26.0–34.0)
MCHC: 33.8 g/dL (ref 30.0–36.0)
MCV: 90 fL (ref 80.0–100.0)
Platelets: 219 10*3/uL (ref 150–400)
RBC: 5.29 MIL/uL (ref 4.22–5.81)
RDW: 13.2 % (ref 11.5–15.5)
WBC: 10.5 10*3/uL (ref 4.0–10.5)
nRBC: 0 % (ref 0.0–0.2)

## 2021-07-02 LAB — URINE DRUG SCREEN, QUALITATIVE (ARMC ONLY)
Amphetamines, Ur Screen: POSITIVE — AB
Barbiturates, Ur Screen: NOT DETECTED
Benzodiazepine, Ur Scrn: NOT DETECTED
Cannabinoid 50 Ng, Ur ~~LOC~~: NOT DETECTED
Cocaine Metabolite,Ur ~~LOC~~: NOT DETECTED
MDMA (Ecstasy)Ur Screen: NOT DETECTED
Methadone Scn, Ur: NOT DETECTED
Opiate, Ur Screen: NOT DETECTED
Phencyclidine (PCP) Ur S: NOT DETECTED
Tricyclic, Ur Screen: NOT DETECTED

## 2021-07-02 LAB — ETHANOL: Alcohol, Ethyl (B): <10 mg/dL (ref <10)

## 2021-07-02 LAB — SALICYLATE LEVEL: Salicylate Lvl: <7 mg/dL — ABNORMAL LOW (ref 7.0–30.0)

## 2021-07-02 LAB — ACETAMINOPHEN LEVEL: Acetaminophen (Tylenol), Serum: <10 ug/mL — ABNORMAL LOW (ref 10–30)

## 2021-07-02 NOTE — ED Notes (Signed)
Patient states he is leaving, and walks away from treatment area. ?

## 2021-07-02 NOTE — ED Provider Notes (Signed)
? ?  [facility] ?Provider Note ? ? ? Event Date/Time  ? First MD Initiated Contact with Patient 07/02/21 1911   ?  (approximate) ? ?History  ? ?Chief Complaint: Psychiatric Evaluation ? ?HPI ? ?Nicholas Lindsey is a 55 y.o. male with a past medical history of bipolar and substance abuse presents to the emergency department with multiple complaints.  Per triage note patient states he felt like something was in his eyes and nose and was hearing a buzzing sound at times.  Unfortunately prior to being able to evaluate the patient he got up and walked out.  I was able to physically see the patient.  He was awake alert talking, ambulating without any issue.  Nurse attempted to get the patient to stay with the patient refused and left.  Per triage nurse patient denied any SI or HI, not under IVC. ? ?Physical Exam  ? ?Triage Vital Signs: ?ED Triage Vitals  ?Enc Vitals Group  ?   BP 07/02/21 1831 (!) 141/96  ?   Pulse Rate 07/02/21 1831 88  ?   Resp 07/02/21 1831 18  ?   Temp 07/02/21 1831 98.6 ?F (37 ?C)  ?   Temp Source 07/02/21 1831 Oral  ?   SpO2 07/02/21 1831 99 %  ?   Weight 07/02/21 1832 175 lb (79.4 kg)  ?   Height 07/02/21 1832 '5\' 7"'$  (1.702 m)  ?   Head Circumference --   ?   Peak Flow --   ?   Pain Score 07/02/21 1832 7  ?   Pain Loc --   ?   Pain Edu? --   ?   Excl. in [place]? --   ? ? ?Most recent vital signs: ?Vitals:  ? 07/02/21 1831  ?BP: (!) 141/96  ?Pulse: 88  ?Resp: 18  ?Temp: 98.6 ?F (37 ?C)  ?SpO2: 99%  ? ? ?General: Awake, no distress.  Somewhat rapid speech at times  ?Resp:  Normal respiratory effort.  No respiratory distress. ? ? ?MEDICATIONS ORDERED IN ED: ?Medications - No data to display ? ? ?IMPRESSION / MDM / ASSESSMENT AND PLAN / ED COURSE  ?I reviewed the triage vital signs and the nursing notes. ? ?Patient presents to the emergency department for multiple complaints.  While sitting in the hallway next to my desk the patient told the nurse that he just cannot do this anymore and he  needs to leave.  Patient got up and walked out of the emergency department.  Nurse attempted to stop the patient to see if he would be willing to wait patient refused and continued to leave.  Patient ambulating without issue.  No acute distress.  Patient was not under IVC. ? ?FINAL CLINICAL IMPRESSION(S) / ED DIAGNOSES  ? ?Medical evaluation ? ? ?Note:  This document was prepared using Dragon voice recognition software and may include unintentional dictation errors. ?  ?Harvest Dark, MD ?07/02/21 1919 ? ?

## 2021-07-02 NOTE — ED Notes (Signed)
Pt seen by nursing stated that he came in and explained to triage that he was having some pain in his eyes however he "Didn't want to say anything but I got something in my pecker" pt also stated that he was having buzzing and vibrating in his nose and in his kidney.  Nicholas Lindsey stated that he has suffered from auditory hallucinations in past and he is currently experiencing these.  He denies any thoughts of SI or HI but is requesting help with physical problems as well has the auditory hallucinations.  Pt also stated that he has been unable to 'sleep in a few days' and does occationaly use drugs.  UDS obtained and tubed to lab ?

## 2021-07-02 NOTE — ED Triage Notes (Addendum)
Pt to ED via POV from home. Pt states he feels like he has something in his eyes and nose and reports he hears a buzzing noise. Pt denies SI or HI. Pt denies etoh or other substances. Pt also reports abdominal pain. Pt states he was here a month ago for the same. Pt with hx bipolar disorder and meth use.  ?

## 2021-07-02 NOTE — ED Notes (Signed)
Patient did not return for treatment. ?

## 2021-07-07 ENCOUNTER — Other Ambulatory Visit (HOSPITAL_COMMUNITY): Payer: Self-pay

## 2022-10-01 DIAGNOSIS — H5789 Other specified disorders of eye and adnexa: Secondary | ICD-10-CM | POA: Diagnosis not present

## 2022-10-01 DIAGNOSIS — Z77098 Contact with and (suspected) exposure to other hazardous, chiefly nonmedicinal, chemicals: Secondary | ICD-10-CM | POA: Diagnosis not present

## 2022-10-01 DIAGNOSIS — H579 Unspecified disorder of eye and adnexa: Secondary | ICD-10-CM | POA: Diagnosis not present

## 2022-11-24 ENCOUNTER — Other Ambulatory Visit (HOSPITAL_COMMUNITY): Payer: Self-pay

## 2023-02-25 ENCOUNTER — Other Ambulatory Visit: Payer: Self-pay

## 2023-02-25 DIAGNOSIS — Z5321 Procedure and treatment not carried out due to patient leaving prior to being seen by health care provider: Secondary | ICD-10-CM | POA: Insufficient documentation

## 2023-02-25 DIAGNOSIS — L02512 Cutaneous abscess of left hand: Secondary | ICD-10-CM | POA: Diagnosis present

## 2023-02-25 LAB — BASIC METABOLIC PANEL
Anion gap: 9 (ref 5–15)
BUN: 26 mg/dL — ABNORMAL HIGH (ref 6–20)
CO2: 21 mmol/L — ABNORMAL LOW (ref 22–32)
Calcium: 8.3 mg/dL — ABNORMAL LOW (ref 8.9–10.3)
Chloride: 103 mmol/L (ref 98–111)
Creatinine, Ser: 0.91 mg/dL (ref 0.61–1.24)
GFR, Estimated: >60 mL/min (ref >60)
Glucose, Bld: 172 mg/dL — ABNORMAL HIGH (ref 70–99)
Potassium: 3.8 mmol/L (ref 3.5–5.1)
Sodium: 133 mmol/L — ABNORMAL LOW (ref 135–145)

## 2023-02-25 LAB — CBC WITH DIFFERENTIAL/PLATELET
Abs Immature Granulocytes: 0.03 10*3/uL (ref 0.00–0.07)
Basophils Absolute: 0.1 10*3/uL (ref 0.0–0.1)
Basophils Relative: 1 %
Eosinophils Absolute: 0.2 10*3/uL (ref 0.0–0.5)
Eosinophils Relative: 3 %
HCT: 43.2 % (ref 39.0–52.0)
Hemoglobin: 14.9 g/dL (ref 13.0–17.0)
Immature Granulocytes: 0 %
Lymphocytes Relative: 26 %
Lymphs Abs: 2.4 10*3/uL (ref 0.7–4.0)
MCH: 31.2 pg (ref 26.0–34.0)
MCHC: 34.5 g/dL (ref 30.0–36.0)
MCV: 90.4 fL (ref 80.0–100.0)
Monocytes Absolute: 0.8 10*3/uL (ref 0.1–1.0)
Monocytes Relative: 8 %
Neutro Abs: 6 10*3/uL (ref 1.7–7.7)
Neutrophils Relative %: 62 %
Platelets: 177 10*3/uL (ref 150–400)
RBC: 4.78 MIL/uL (ref 4.22–5.81)
RDW: 12.8 % (ref 11.5–15.5)
WBC: 9.5 10*3/uL (ref 4.0–10.5)
nRBC: 0 % (ref 0.0–0.2)

## 2023-02-25 NOTE — ED Triage Notes (Signed)
Pt reports "sore" to left hand that has developed an abscess, swelling and redness to hand over the last couple days.

## 2023-02-26 ENCOUNTER — Emergency Department
Admission: EM | Admit: 2023-02-26 | Discharge: 2023-02-26 | Discharge status: Against Medical Advice | Payer: MEDICAID | Attending: Emergency Medicine | Admitting: Emergency Medicine

## 2023-02-26 NOTE — ED Notes (Signed)
While attempting to call for another pt in [facility], pt became boisterous and continued as he left [facility] with steady and even gait.

## 2023-04-27 ENCOUNTER — Encounter: Payer: Self-pay | Admitting: Family Medicine

## 2023-04-27 ENCOUNTER — Ambulatory Visit: Payer: MEDICAID | Admitting: Family Medicine

## 2023-04-27 VITALS — BP 129/74 | HR 63 | Temp 94.0°F | Resp 14 | Ht 67.0 in | Wt 172.0 lb

## 2023-04-27 DIAGNOSIS — K732 Chronic active hepatitis, not elsewhere classified: Secondary | ICD-10-CM | POA: Diagnosis not present

## 2023-04-27 DIAGNOSIS — F17220 Nicotine dependence, chewing tobacco, uncomplicated: Secondary | ICD-10-CM

## 2023-04-27 DIAGNOSIS — F319 Bipolar disorder, unspecified: Secondary | ICD-10-CM

## 2023-04-27 DIAGNOSIS — L03114 Cellulitis of left upper limb: Secondary | ICD-10-CM

## 2023-04-27 DIAGNOSIS — R030 Elevated blood-pressure reading, without diagnosis of hypertension: Secondary | ICD-10-CM | POA: Diagnosis not present

## 2023-04-27 DIAGNOSIS — F22 Delusional disorders: Secondary | ICD-10-CM

## 2023-04-27 DIAGNOSIS — K089 Disorder of teeth and supporting structures, unspecified: Secondary | ICD-10-CM | POA: Insufficient documentation

## 2023-04-27 DIAGNOSIS — Z114 Encounter for screening for human immunodeficiency virus [HIV]: Secondary | ICD-10-CM | POA: Diagnosis not present

## 2023-04-27 DIAGNOSIS — Z7689 Persons encountering health services in other specified circumstances: Secondary | ICD-10-CM

## 2023-04-27 DIAGNOSIS — D492 Neoplasm of unspecified behavior of bone, soft tissue, and skin: Secondary | ICD-10-CM

## 2023-04-27 DIAGNOSIS — H539 Unspecified visual disturbance: Secondary | ICD-10-CM | POA: Insufficient documentation

## 2023-04-27 DIAGNOSIS — L03012 Cellulitis of left finger: Secondary | ICD-10-CM

## 2023-04-27 DIAGNOSIS — Z59819 Housing instability, housed unspecified: Secondary | ICD-10-CM

## 2023-04-27 MED ORDER — SULFAMETHOXAZOLE-TRIMETHOPRIM 800-160 MG PO TABS: 1.0000 | ORAL_TABLET | Freq: Two times a day (BID) | ORAL | 0 refills | Status: DC

## 2023-04-27 MED ORDER — MUPIROCIN 2 % EX OINT: 1.0000 | TOPICAL_OINTMENT | Freq: Two times a day (BID) | CUTANEOUS | 0 refills | Status: DC
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
## 2023-04-27 NOTE — Assessment & Plan Note (Signed)
Pt managed by Psychiatry through [facility]  Continue Haldol, risperidone, Wellbutrin, cogentin, citalopram, Cymbalta, and hydroxyzine as prescribed by psych - appreciate their management.  Appears stable today, caregiver in waiting room while pt in visit.

## 2023-04-27 NOTE — Assessment & Plan Note (Signed)
Recurrent skin lesions with a history of cellulitis. No current systemic symptoms. -Order topical and oral antibiotics to cover for MRSA cellulitis. - Referral to Derm given chronicity and ID -Order labs to assess kidney and liver function, electrolytes, lipid panel, HIV, and Hepatitis C.

## 2023-04-27 NOTE — Assessment & Plan Note (Signed)
Living with brother, intermittent homelessness.  Current living situation not well Referral to [facility]  Pt also working with [facility]

## 2023-04-27 NOTE — Assessment & Plan Note (Signed)
Concerns for worsening vision changes eye wear no longer assisting as well Referral placement for opthalmology for pt

## 2023-04-27 NOTE — Assessment & Plan Note (Signed)
Concerns for chronic scabbing on L forearm that bleeds, irregular borders, coloring and scaling Refer to Derm for further eval for potential cancerous cells.

## 2023-04-27 NOTE — Assessment & Plan Note (Signed)
Pt uses chew tobacco, poor oral hygiene Discussed following risks: Dental problems: Chewing tobacco can cause teeth to wear down faster, which can lead to tooth loss and other dental issues. Oral cancer: Chewing tobacco can cause white patches and red sores in the mouth that can develop into cancer. Bad breath: Chewing tobacco can cause bad breath and stained teeth. Taste changes: Chewing tobacco can affect your sense of taste.  Recommend establishing dentist.

## 2023-04-27 NOTE — Assessment & Plan Note (Signed)
Elevated SBP Goal <120/80 Low sodium diet Exercise as able CMP and Lipid checked

## 2023-04-27 NOTE — Assessment & Plan Note (Addendum)
Welcome to [facility] Concerns today addressed: skin lesions, vision, and hepc  Routine labs ordered Plan for cologuard once pt has stable living situation

## 2023-04-27 NOTE — Assessment & Plan Note (Signed)
Missing several teeth and obvious decay Dry tongue Recommend increasing oral hydration - water and biotene wash BID oral care with brushing and flossing Establishing dentist.

## 2023-04-28 LAB — HEPATITIS C ANTIBODY: Hep C Virus Ab: REACTIVE — AB

## 2023-04-28 LAB — LIPID PANEL
Chol/HDL Ratio: 3.6 {ratio} (ref 0.0–5.0)
Cholesterol, Total: 137 mg/dL (ref 100–199)
HDL: 38 mg/dL — ABNORMAL LOW (ref >39)
LDL Chol Calc (NIH): 87 mg/dL (ref 0–99)
Triglycerides: 53 mg/dL (ref 0–149)
VLDL Cholesterol Cal: 12 mg/dL (ref 5–40)

## 2023-04-28 LAB — COMPREHENSIVE METABOLIC PANEL
ALT: 43 [IU]/L (ref 0–44)
AST: 47 [IU]/L — ABNORMAL HIGH (ref 0–40)
Albumin: 3.8 g/dL (ref 3.8–4.9)
Alkaline Phosphatase: 96 [IU]/L (ref 44–121)
BUN/Creatinine Ratio: 11 (ref 9–20)
BUN: 8 mg/dL (ref 6–24)
Bilirubin Total: 0.3 mg/dL (ref 0.0–1.2)
CO2: 23 mmol/L (ref 20–29)
Calcium: 9.1 mg/dL (ref 8.7–10.2)
Chloride: 105 mmol/L (ref 96–106)
Creatinine, Ser: 0.71 mg/dL — ABNORMAL LOW (ref 0.76–1.27)
Globulin, Total: 3 g/dL (ref 1.5–4.5)
Glucose: 96 mg/dL (ref 70–99)
Potassium: 4.7 mmol/L (ref 3.5–5.2)
Sodium: 141 mmol/L (ref 134–144)
Total Protein: 6.8 g/dL (ref 6.0–8.5)
eGFR: 106 mL/min/{1.73_m2} (ref >59)

## 2023-04-28 LAB — HIV ANTIBODY (ROUTINE TESTING W REFLEX): HIV Screen 4th Generation wRfx: NONREACTIVE

## 2023-04-30 ENCOUNTER — Telehealth: Payer: Self-pay | Admitting: *Deleted

## 2023-04-30 NOTE — Progress Notes (Unsigned)
Complex Care Management Note Care Guide Note  04/30/2023 Name: Nicholas Lindsey MRN: [id number] DOB: 02-Sep-1965   Complex Care Management Outreach Attempts: An unsuccessful telephone outreach was attempted today to offer the patient information about available complex care management services.  Follow Up Plan:  Additional outreach attempts will be made to offer the patient complex care management information and services.   Encounter Outcome:  No Answer  Burman Nieves, CMA, Care Guide [facility]  [facility], [facility] Guide Direct Dial: [phone]  Fax: [phone] Website: [email or web address]

## 2023-04-30 NOTE — Progress Notes (Signed)
Pt does not have access to MyChart, my also call alternate Nicholas Lindsey with results and plan per pt.   HIV - negative Hep C - positive Chronic - pt has ben referred to ID CMP - Chronically elevated AST - most likely related to hep C positive - referral to ID; Creatinine subtle decrease, increase daily water intake, otherwise electrolytes normal and improved from previous results. Lipid - normal cholesterol and LDL, decrease HDL - increase nuts, fish, yogurts - may take daily omega 3. Risk is borderline - will reassess at physical. Dietary changes and exercise encouraged.   The 10-year ASCVD risk score (Arnett DK, et al., 2019) is: 6.9%   Values used to calculate the score:     Age: 58 years     Sex: Male     Is Non-Hispanic African American: No     Diabetic: No     Tobacco smoker: No     Systolic Blood Pressure: 129 mmHg     Is BP treated: No     HDL Cholesterol: 38 mg/dL     Total Cholesterol: 137 mg/dL

## 2023-05-01 NOTE — Progress Notes (Unsigned)
Complex Care Management Note Care Guide Note  05/01/2023 Name: Nicholas Lindsey MRN: [id number] DOB: 09-Mar-1966   Complex Care Management Outreach Attempts: A second unsuccessful outreach was attempted today to offer the patient with information about available complex care management services.  Follow Up Plan:  Additional outreach attempts will be made to offer the patient complex care management information and services.   Encounter Outcome:  No Answer  Burman Nieves, CMA, Care Guide [facility]  [facility], [facility] Guide Direct Dial: [phone]  Fax: [phone] Website: [email or web address]

## 2023-05-02 NOTE — Progress Notes (Signed)
Complex Care Management Note Care Guide Note  05/02/2023 Name: DIAMANTE DEBENEDICTIS MRN: [id number] DOB: 1966/03/08   Complex Care Management Outreach Attempts: A third unsuccessful outreach was attempted today to offer the patient with information about available complex care management services.  Follow Up Plan:  No further outreach attempts will be made at this time. We have been unable to contact the patient to offer or enroll patient in complex care management services.  Encounter Outcome:  No Answer  Burman Nieves, CMA, Care Guide [facility]  [facility], [facility] Guide Direct Dial: [phone]  Fax: [phone] Website: [email or web address]

## 2023-09-22 ENCOUNTER — Emergency Department
Admission: EM | Admit: 2023-09-22 | Discharge: 2023-09-22 | Disposition: A | Discharge status: Home / Self Care | Payer: MEDICAID | Attending: Emergency Medicine | Admitting: Emergency Medicine

## 2023-09-22 ENCOUNTER — Other Ambulatory Visit: Payer: Self-pay

## 2023-09-22 DIAGNOSIS — F17200 Nicotine dependence, unspecified, uncomplicated: Secondary | ICD-10-CM | POA: Insufficient documentation

## 2023-09-22 DIAGNOSIS — J340 Abscess, furuncle and carbuncle of nose: Secondary | ICD-10-CM

## 2023-09-22 DIAGNOSIS — J0100 Acute maxillary sinusitis, unspecified: Secondary | ICD-10-CM | POA: Diagnosis not present

## 2023-09-22 DIAGNOSIS — R519 Headache, unspecified: Secondary | ICD-10-CM | POA: Diagnosis present

## 2023-09-22 MED ORDER — AMOXICILLIN-POT CLAVULANATE 875-125 MG PO TABS: 1.0000 | ORAL_TABLET | Freq: Two times a day (BID) | ORAL | 0 refills | Status: AC

## 2023-09-22 NOTE — ED Triage Notes (Signed)
 C/O sinus pressure and runny nose x 1 week. Arrives from home via [facility].  VS wnl.

## 2023-09-22 NOTE — ED Provider Notes (Signed)
 [facility] Provider Note    Event Date/Time   First MD Initiated Contact with Patient 09/22/23 1634     (approximate)   History   Facial Pain   HPI  Nicholas Lindsey is a 58 y.o. male ending to the emergency department with rhinorrhea, lesion in his right nostril, and sinus pressure x 1 week.  Patient states he has had some string-like drainage once from his right nostril after picking at the area.  Denies fever, chest pain, abdominal pain, shortness of breath, dyspnea, muscle pain, otalgia, sore throat, cough, congestion.  No known allergies.  Pertinent past medical history includes hepatitis, tobacco dependence, methamphetamine use disorder, bipolar 1 disorder.     Physical Exam   Triage Vital Signs: ED Triage Vitals  Encounter Vitals Group     BP 09/22/23 1451 130/71     Girls Systolic BP Percentile --      Girls Diastolic BP Percentile --      Boys Systolic BP Percentile --      Boys Diastolic BP Percentile --      Pulse Rate 09/22/23 1451 (!) 54     Resp 09/22/23 1451 16     Temp 09/22/23 1451 97.6 F (36.4 C)     Temp Source 09/22/23 1451 Oral     SpO2 09/22/23 1451 97 %     Weight 09/22/23 1447 171 lb 15.3 oz (78 kg)     Height --      Head Circumference --      Peak Flow --      Pain Score --      Pain Loc --      Pain Education --      Exclude from Growth Chart --     Most recent vital signs: Vitals:   09/22/23 1451  BP: 130/71  Pulse: (!) 54  Resp: 16  Temp: 97.6 F (36.4 C)  SpO2: 97%   General: Well-appearing, in no acute distress. Appears stated age. Head: Normocephalic, atraumatic. Eyes: No scleral icterus or conjunctival injection. Ears/Nose/Throat: TMs intact b/l, cerumen present in the right ear. Nares patent, mild erythema noted on the right side with 1x1 cm dried crusting lesion that is tender to palpation. Oropharynx moist, no erythema or exudate. Dentition intact.  Tender to palpation of the right and left  maxillary sinuses.  Neck: Supple, no lymphadenopathy. CV: Good peripheral perfusion. Respiratory: Breath sounds clear b/l. No wheezes, rales, or rhonchi. No respiratory distress. Normal respiratory effort. GI: Soft, non-distended, non-tender. No rebound or guarding.  Skin:Warm, dry, intact.  Psychiatric: Mood and affect appropriate. Thought processes coherent.     ED Results / Procedures / Treatments   Labs (all labs ordered are listed, but only abnormal results are displayed) Labs Reviewed - No data to display   EKG     RADIOLOGY    PROCEDURES:  Critical Care performed: No  Procedures   MEDICATIONS ORDERED IN ED: Medications - No data to display   IMPRESSION / MDM / ASSESSMENT AND PLAN / ED COURSE  I reviewed the triage vital signs and the nursing notes.                              Differential diagnosis includes, but is not limited to, sinusitis, nasal abscess, allergic rhinitis  Patient's presentation is most consistent with acute, uncomplicated illness.  Patient is a 58 year old male who presented with suspected nasal abscess,  please see media for image.  He also has tenderness with palpation of his maxillary sinuses bilaterally.  I will provide him with a prescription for Augmentin  that can be sent to his pharmacy of choice; this should help clear the small abscess and his sinus concerns.  Patient also encouraged to quit picking at the affected area in his nose.  He was given information for follow-up with the [facility] if this is not improving after completion of the antibiotics.  Emergency department return precautions were discussed with the patient.  Patient is in agreement to the treatment plan.  Patient is stable for discharge.    FINAL CLINICAL IMPRESSION(S) / ED DIAGNOSES   Final diagnoses:  Nasal septal abscess  Acute non-recurrent maxillary sinusitis     Rx / DC Orders   ED Discharge Orders          Ordered    amoxicillin -clavulanate  (AUGMENTIN ) 875-125 MG tablet  2 times daily        09/22/23 1700             Note:  This document was prepared using Dragon voice recognition software and may include unintentional dictation errors.    Thomasenia Flesher, PA-C 09/22/23 1717    Shane Darling, MD 09/22/23 [phone]

## 2023-09-22 NOTE — ED Notes (Signed)
Taxi called for patient at this time.

## 2023-09-22 NOTE — Discharge Instructions (Addendum)
 You were seen in the emergency department for an abscess in your nose, and an infection of your sinuses.  Please pick up and take the antibiotic as prescribed.  Please follow-up with the [facility] if you are still experiencing symptoms after the completion of the antibiotic.   Call your doctor sooner or return to the ED if you develop worsening signs of infection such as: increased redness, increased pain, pus that is not improving with antibiotic or fever that is not decreasing with Tylenol  and or ibuprofen use.   Abscess An abscess is an infected area that contains a collection of pus and debris. It can occur in almost any part of the body. An abscess is also known as a furuncle or boil. CAUSES  An abscess occurs when tissue gets infected. This can occur from blockage of oil or sweat glands, infection of hair follicles, or a minor injury to the skin. As the body tries to fight the infection, pus collects in the area and creates pressure under the skin. This pressure causes pain. People with weakened immune systems have difficulty fighting infections and get certain abscesses more often.  SYMPTOMS Usually an abscess develops on the skin and becomes a painful mass that is red, warm, and tender. If the abscess forms under the skin, you may feel a moveable soft area under the skin. Some abscesses break open (rupture) on their own, but most will continue to get worse without care. The infection can spread deeper into the body and eventually into the bloodstream, causing you to feel ill.  DIAGNOSIS  Your caregiver will take your medical history and perform a physical exam. A sample of fluid may also be taken from the abscess to determine what is causing your infection. TREATMENT  Your caregiver may prescribe antibiotic medicines to fight the infection. However, taking antibiotics alone usually does not cure an abscess. Your caregiver may need to make a small cut (incision) in the  abscess to drain the pus. In some cases, gauze is packed into the abscess to reduce pain and to continue draining the area. HOME CARE INSTRUCTIONS  Only take over-the-counter or prescription medicines for pain, discomfort, or fever as directed by your caregiver. If you were prescribed antibiotics, take them as directed. Finish them even if you start to feel better. If gauze is used, follow your caregiver's directions for changing the gauze. To avoid spreading the infection: Keep your draining abscess covered with a bandage. Wash your hands well. Do not share personal care items, towels, or whirlpools with others. Avoid skin contact with others. Keep your skin and clothes clean around the abscess. Keep all follow-up appointments as directed by your caregiver. SEEK MEDICAL CARE IF:  You have increased pain, swelling, redness, fluid drainage, or bleeding. You have muscle aches, chills, or a general ill feeling. You have a fever. MAKE SURE YOU:  Understand these instructions. Will watch your condition. Will get help right away if you are not doing well or get worse. Document Released: 01/04/2005 Document Revised: 09/26/2011 Document Reviewed: 06/09/2011 [facility] Patient Information 2015 [place], [place]. This information is not intended to replace advice given to you by your health care provider. Make sure you discuss any questions you have with your health care provider.  Abscess Care After An abscess (also called a boil or furuncle) is an infected area that contains a collection of pus. Signs and symptoms of an abscess include pain, tenderness, redness, or hardness, or you may feel  a moveable soft area under your skin. An abscess can occur anywhere in the body. The infection may spread to surrounding tissues causing cellulitis. A cut (incision) by the surgeon was made over your abscess and the pus was drained out. Gauze may have been packed into the space to provide a drain that will allow the  cavity to heal from the inside outwards. The boil may be painful for 5 to 7 days. Most people with a boil do not have high fevers. Your abscess, if seen early, may not have localized, and may not have been lanced. If not, another appointment may be required for this if it does not get better on its own or with medications. HOME CARE INSTRUCTIONS  Only take over-the-counter or prescription medicines for pain, discomfort, or fever as directed by your caregiver. When you bathe, soak and then remove gauze or iodoform packs at least daily or as directed by your caregiver. You may then wash the wound gently with mild soapy water. Repack with gauze or do as your caregiver directs. SEEK IMMEDIATE MEDICAL CARE IF:  You develop increased pain, swelling, redness, drainage, or bleeding in the wound site. You develop signs of generalized infection including muscle aches, chills, fever, or a general ill feeling. An oral temperature above 102 F (38.9 C) develops, not controlled by medication. See your caregiver for a recheck if you develop any of the symptoms described above. If medications (antibiotics) were prescribed, take them as directed. Document Released: 10/13/2004 Document Revised: 06/19/2011 Document Reviewed: 06/10/2007 [facility] Patient Information 2015 [place], [place]. This information is not intended to replace advice given to you by your health care provider. Make sure you discuss any questions you have with your health care provider.  Cellulitis Cellulitis is an infection of the skin and the tissue beneath it. The infected area is usually red and tender. Cellulitis occurs most often in the arms and lower legs.  CAUSES  Cellulitis is caused by bacteria that enter the skin through cracks or cuts in the skin. The most common types of bacteria that cause cellulitis are staphylococci and streptococci. SIGNS AND SYMPTOMS  Redness and warmth. Swelling. Tenderness or pain. Fever. DIAGNOSIS  Your  health care provider can usually determine what is wrong based on a physical exam. Blood tests may also be done. TREATMENT  Treatment usually involves taking an antibiotic medicine. HOME CARE INSTRUCTIONS  Take your antibiotic medicine as directed by your health care provider. Finish the antibiotic even if you start to feel better. Keep the infected arm or leg elevated to reduce swelling. Apply a warm cloth to the affected area up to 4 times per day to relieve pain. Take medicines only as directed by your health care provider. Keep all follow-up visits as directed by your health care provider. SEEK MEDICAL CARE IF:  You notice red streaks coming from the infected area. Your red area gets larger or turns dark in color. Your bone or joint underneath the infected area becomes painful after the skin has healed. Your infection returns in the same area or another area. You notice a swollen bump in the infected area. You develop new symptoms. You have a fever. SEEK IMMEDIATE MEDICAL CARE IF:  You feel very sleepy. You develop vomiting or diarrhea. You have a general ill feeling (malaise) with muscle aches and pains. MAKE SURE YOU:  Understand these instructions. Will watch your condition. Will get help right away if you are not doing well or get worse. Document Released: 01/04/2005  Document Revised: 08/11/2013 Document Reviewed: 06/12/2011 [facility] Patient Information 2015 [place], [place]. This information is not intended to replace advice given to you by your health care provider. Make sure you discuss any questions you have with your health care provider.

## 2023-10-25 ENCOUNTER — Ambulatory Visit (INDEPENDENT_AMBULATORY_CARE_PROVIDER_SITE_OTHER): Payer: MEDICAID | Admitting: Family Medicine

## 2023-10-25 ENCOUNTER — Encounter: Payer: Self-pay | Admitting: Family Medicine

## 2023-10-25 VITALS — BP 120/72 | HR 74 | Ht 67.0 in | Wt 165.9 lb

## 2023-10-25 DIAGNOSIS — Z59819 Housing instability, housed unspecified: Secondary | ICD-10-CM

## 2023-10-25 DIAGNOSIS — Z0000 Encounter for general adult medical examination without abnormal findings: Secondary | ICD-10-CM

## 2023-10-25 DIAGNOSIS — Z1211 Encounter for screening for malignant neoplasm of colon: Secondary | ICD-10-CM | POA: Diagnosis not present

## 2023-10-25 DIAGNOSIS — K732 Chronic active hepatitis, not elsewhere classified: Secondary | ICD-10-CM | POA: Diagnosis not present

## 2023-10-25 DIAGNOSIS — H539 Unspecified visual disturbance: Secondary | ICD-10-CM

## 2023-10-25 DIAGNOSIS — F319 Bipolar disorder, unspecified: Secondary | ICD-10-CM

## 2023-10-25 NOTE — Progress Notes (Signed)
 "  Complete physical exam  Patient: Nicholas Lindsey   DOB: 07-19-1965   58 y.o. Male  MRN: [id number]  Introduced to nurse practitioner role and practice setting.  All questions answered.  Discussed provider/patient relationship and expectations.   Subjective:    Chief Complaint  Patient presents with   Annual Exam    Diet- General, unhealthy Exercise- Not really Feeling- Slightly depressed, worrying and feels like he shouldn't be Sleep- Slept for 3 days and didn't even get up to use the bathroom, feels as though he went into a coma.  Happened a couple of months ago. Concerns- He slips sometimes, he stated he forgets taking it in the morning sometimes and in the evening.  Colonoscopy- ok to order Hepatitis C screening- states he was diagnosed a year or two ago and wants to be treated for it.    Nicholas Lindsey is a 58 y.o. male who presents today for a complete physical exam. He reports consuming a general diet. The patient does not participate in regular exercise at present. He generally feels well. He reports sleeping well. He does not have additional problems to discuss today.   Discussed the use of AI scribe software for clinical note transcription with the patient, who gave verbal consent to proceed.  History of Present Illness Nicholas Lindsey is a 58 year old male who presents for an annual physical exam.  He is not currently seeing psychiatry in person but is being monitored by him. He continues to receive his medications, though he is unsure if they are being prescribed or just filled. He recalls receiving a shot and taking risperidone tablets but does not have a list of his current medications with him.  He has recently moved into a new house with the help of [facility] and is living independently. He describes the house as nice and solid, with his expenses being partially covered.  He has a history of hepatitis C and is concerned about his prostate and hepatitis C. He  does not recall receiving a call for a referral to infectious disease.  He reports a decline in his eyesight over the last three months, requiring him to wear glasses more frequently. He has never had his eyes checked before.  He does not have a dentist and recalls a past experience where he had to pay cash to have a tooth pulled.  Most recent fall risk assessment:    10/25/2023    3:10 PM  Fall Risk   Falls in the past year? 1  Number falls in past yr: 1  Injury with Fall? 0     Most recent depression screenings:    10/25/2023    3:10 PM 04/27/2023   11:48 AM  PHQ 2/9 Scores  PHQ - 2 Score 2 4  PHQ- 9 Score 8 15    Vision:Within last year and Dental: No current dental problems and No regular dental care   Patient Active Problem List   Diagnosis Date Noted   Elevated blood pressure reading in office without diagnosis of hypertension 04/27/2023   Encounter for screening for HIV 04/27/2023   Chronic active hepatitis (HCC) 04/27/2023   Cellulitis of multiple sites of left hand and fingers 04/27/2023   Vision changes 04/27/2023   Housing instability 04/27/2023   Abnormal skin growth 04/27/2023   Paranoia (HCC) 04/27/2023   Tobacco dependence due to chewing tobacco 04/27/2023   Poor dentition 04/27/2023   Methamphetamine use disorder, severe, dependence (HCC) 11/29/2019  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   Encounter to establish care with new doctor 08/26/2019   Drug abuse, IV (HCC) 08/26/2019   Aggression 03/17/2019   Bipolar 1 disorder (HCC) 03/17/2019   Nodule of anterior chest wall 01/19/2017   Past Medical History:  Diagnosis Date   Anxiety    Arthritis    Depression    History of kidney stones    Left breast abscess 2008   I&D required- [place] (per patient)   Nodule of anterior chest wall 01/19/2017   Substance abuse (HCC)    Past Surgical History:  Procedure Laterality Date   ABCESS DRAINAGE Left 2008   Breast- [place],  [place]   Social History   Tobacco Use   Smoking status: Never   Smokeless tobacco: Current    Types: Chew   Tobacco comments:    1/2 Can chew Daily  Vaping Use   Vaping status: Never Used  Substance Use Topics   Alcohol use: Not Currently    Comment: 12 Beers / Weekly   Drug use: Yes    Types: Methamphetamines   No Known Allergies    Patient Care Team: Wellington Curtis LABOR, FNP as PCP - General (Family Medicine)   Outpatient Medications Prior to Visit  Medication Sig   benztropine  (COGENTIN ) 1 MG tablet Take 1 tablet (1 mg total) by mouth once daily.   buPROPion (WELLBUTRIN XL) 150 MG 24 hr tablet Take 150 mg by mouth daily.   citalopram (CELEXA) 20 MG tablet Take 20 mg by mouth daily.   DULoxetine  (CYMBALTA ) 30 MG capsule Take 1 capsule (30 mg total) by mouth once daily. (Patient not taking: Reported on 06/04/2021)   haloperidol  (HALDOL ) 5 MG tablet Take 1 tablet (5 mg total) by mouth 2 (two) times daily. (Patient not taking: Reported on 06/04/2021)   hydrOXYzine (ATARAX) 25 MG tablet Take 25 mg by mouth as needed for anxiety.   risperiDONE (RISPERDAL) 2 MG tablet Take 2 mg by mouth AC breakfast.   risperiDONE (RISPERDAL) 3 MG tablet Take 3 mg by mouth at bedtime.   [DISCONTINUED] mupirocin  ointment (BACTROBAN ) 2 % Apply 1 Application topically 2 (two) times daily. Apply to affected skin lesions two times per day   [DISCONTINUED] sulfamethoxazole -trimethoprim  (BACTRIM  DS) 800-160 MG tablet Take 1 tablet by mouth 2 (two) times daily.   No facility-administered medications prior to visit.    ROS        Objective:     BP 120/72 (BP Location: Left Arm, Patient Position: Sitting, Cuff Size: Normal)   Pulse 74   Ht 5' 7 (1.702 m)   Wt 165 lb 14.4 oz (75.3 kg)   SpO2 98%   BMI 25.98 kg/m  BP Readings from Last 3 Encounters:  10/25/23 120/72  09/22/23 130/71  04/27/23 129/74   Wt Readings from Last 3 Encounters:  10/25/23 165 lb 14.4 oz (75.3 kg)  09/22/23 171 lb 15.3  oz (78 kg)  04/27/23 172 lb (78 kg)      Physical Exam Constitutional:      General: He is not in acute distress.    Appearance: Normal appearance. He is normal weight. He is not ill-appearing.  HENT:     Head: Normocephalic.     Right Ear: Tympanic membrane, ear canal and external ear normal.     Left Ear: Tympanic membrane, ear canal and external ear normal.     Nose: Nose normal.     Mouth/Throat:     Mouth: Mucous  membranes are moist.     Dentition: Normal dentition. No gingival swelling or dental caries.     Pharynx: Oropharynx is clear. No oropharyngeal exudate or posterior oropharyngeal erythema.  Eyes:     General: Lids are normal. No visual field deficit.       Right eye: No discharge.        Left eye: No discharge.     Extraocular Movements: Extraocular movements intact.     Right eye: Normal extraocular motion.     Left eye: Normal extraocular motion.     Conjunctiva/sclera: Conjunctivae normal.     Right eye: Right conjunctiva is not injected.     Left eye: Left conjunctiva is not injected.     Pupils: Pupils are equal, round, and reactive to light.  Neck:     Thyroid: No thyroid mass, thyromegaly or thyroid tenderness.     Vascular: No carotid bruit.  Cardiovascular:     Rate and Rhythm: Normal rate.     Pulses: Normal pulses.          Radial pulses are 2+ on the right side and 2+ on the left side.       Posterior tibial pulses are 2+ on the right side and 2+ on the left side.     Heart sounds: Normal heart sounds, S1 normal and S2 normal. No murmur heard.    No friction rub. No gallop.  Pulmonary:     Effort: Pulmonary effort is normal. No respiratory distress.     Breath sounds: Normal breath sounds. No stridor. No wheezing, rhonchi or rales.  Abdominal:     General: Bowel sounds are normal. There is no distension.     Palpations: Abdomen is soft. There is no mass.     Tenderness: There is no abdominal tenderness. There is no right CVA tenderness, left CVA  tenderness, guarding or rebound.     Hernia: No hernia is present.  Musculoskeletal:        General: No swelling or tenderness. Normal range of motion.     Cervical back: Normal range of motion. No rigidity.  Lymphadenopathy:     Cervical: No cervical adenopathy.     Right cervical: No superficial, deep or posterior cervical adenopathy.    Left cervical: No superficial, deep or posterior cervical adenopathy.  Skin:    General: Skin is warm and dry.     Capillary Refill: Capillary refill takes less than 2 seconds.     Findings: No bruising or erythema.  Neurological:     General: No focal deficit present.     Mental Status: He is alert and oriented to person, place, and time. Mental status is at baseline.     GCS: GCS eye subscore is 4. GCS verbal subscore is 5. GCS motor subscore is 6.     Cranial Nerves: No cranial nerve deficit, dysarthria or facial asymmetry.     Sensory: No sensory deficit.     Motor: No weakness, tremor or pronator drift.     Coordination: Romberg sign negative. Coordination normal. Finger-Nose-Finger Test and Heel to [facility] Test normal. Rapid alternating movements normal.     Gait: Gait is intact. Gait normal.  Psychiatric:        Attention and Perception: Attention and perception normal.        Mood and Affect: Mood normal. Affect is flat.        Speech: Speech is delayed.        Behavior: Behavior is  slowed and withdrawn. Behavior is cooperative.        Thought Content: Thought content normal.        Cognition and Memory: Cognition normal. Memory is impaired.        Judgment: Judgment normal.      Results for orders placed or performed in visit on 10/25/23  Lipid panel  Result Value Ref Range   Cholesterol, Total 143 100 - 199 mg/dL   Triglycerides 71 0 - 149 mg/dL   HDL 44 >60 mg/dL   VLDL Cholesterol Cal 14 5 - 40 mg/dL   LDL Chol Calc (NIH) 85 0 - 99 mg/dL   Chol/HDL Ratio 3.3 0.0 - 5.0 ratio  TSH  Result Value Ref Range   TSH 1.760 0.450 - 4.500  uIU/mL  Comprehensive metabolic panel with GFR  Result Value Ref Range   Glucose 83 70 - 99 mg/dL   BUN 15 8 - 27 mg/dL   Creatinine, Ser 9.10 0.76 - 1.27 mg/dL   eGFR 98 >40 fO/fpw/8.26   BUN/Creatinine Ratio 17 10 - 24   Sodium 137 134 - 144 mmol/L   Potassium 4.3 3.5 - 5.2 mmol/L   Chloride 104 96 - 106 mmol/L   CO2 21 20 - 29 mmol/L   Calcium 9.0 8.6 - 10.2 mg/dL   Total Protein 6.7 6.0 - 8.5 g/dL   Albumin 3.9 3.8 - 4.9 g/dL   Globulin, Total 2.8 1.5 - 4.5 g/dL   Bilirubin Total 0.4 0.0 - 1.2 mg/dL   Alkaline Phosphatase 76 44 - 121 IU/L   AST 32 0 - 40 IU/L   ALT 38 0 - 44 IU/L  CBC  Result Value Ref Range   WBC 8.7 3.4 - 10.8 x10E3/uL   RBC 4.39 4.14 - 5.80 x10E6/uL   Hemoglobin 13.5 13.0 - 17.7 g/dL   Hematocrit 59.4 62.4 - 51.0 %   MCV 92 79 - 97 fL   MCH 30.8 26.6 - 33.0 pg   MCHC 33.3 31.5 - 35.7 g/dL   RDW 86.6 88.3 - 84.5 %   Platelets 195 150 - 450 x10E3/uL  PSA  Result Value Ref Range   Prostate Specific Ag, Serum 0.2 0.0 - 4.0 ng/mL   Last CBC Lab Results  Component Value Date   WBC 8.7 10/25/2023   HGB 13.5 10/25/2023   HCT 40.5 10/25/2023   MCV 92 10/25/2023   MCH 30.8 10/25/2023   RDW 13.3 10/25/2023   PLT 195 10/25/2023   Last metabolic panel Lab Results  Component Value Date   GLUCOSE 83 10/25/2023   NA 137 10/25/2023   K 4.3 10/25/2023   CL 104 10/25/2023   CO2 21 10/25/2023   BUN 15 10/25/2023   CREATININE 0.89 10/25/2023   EGFR 98 10/25/2023   CALCIUM 9.0 10/25/2023   PHOS 2.5 04/03/2018   PROT 6.7 10/25/2023   ALBUMIN 3.9 10/25/2023   LABGLOB 2.8 10/25/2023   AGRATIO 1.2 09/10/2019   BILITOT 0.4 10/25/2023   ALKPHOS 76 10/25/2023   AST 32 10/25/2023   ALT 38 10/25/2023   ANIONGAP 9 02/25/2023   Last lipids Lab Results  Component Value Date   CHOL 143 10/25/2023   HDL 44 10/25/2023   LDLCALC 85 10/25/2023   TRIG 71 10/25/2023   CHOLHDL 3.3 10/25/2023   Last hemoglobin A1c Lab Results  Component Value Date    HGBA1C 5.6 09/10/2019   Last thyroid functions Lab Results  Component Value Date   TSH 1.760  10/25/2023   Last vitamin D No results found for: 25OHVITD2, 25OHVITD3, VD25OH Last vitamin B12 and Folate No results found for: VITAMINB12, FOLATE      Assessment & Plan:    Routine Health Maintenance and Physical Exam  Assessment and Plan Assessment & Plan Annual Physical Examination Routine annual physical. Unable to provide medication list. - Order CMP, CBC, TSH, lipid panel, and PSA. - Contact [facility] for updated medication list.   - Things to do to keep yourself healthy  - Exercise at least 30-45 minutes a day, 3-4 days a week.  - Eat a low-fat diet with lots of fruits and vegetables, up to 7-9 servings per day.  - Seatbelts can save your life. Wear them always.  - Smoke detectors on every level of your home, check batteries every year.  - Eye Doctor - have an eye exam every 1-2 years  - Safe sex - if you may be exposed to STDs, use a condom.  - Alcohol -  If you drink, do it moderately, less than 2 drinks per day.  - Health Care Power of Attorney. Choose someone to speak for you if you are not able.  - Depression is common in our stressful world.If you're feeling down or losing interest in things you normally enjoy, please come in for a visit.  - Violence - If anyone is threatening or hurting you, please call immediately.   Hepatitis C Previously positive for hepatitis C. No follow-up with infectious disease specialist - appears was contacted several times. Desires management. - Re-refer to infectious disease. - Ensure he is reachable for scheduling - Left in comments okay to call [facility] to help set up for patient.  Screening for Prostate - Order PSA test.  Colorectal Cancer Screening - Order colonoscopy.  Vision Changes Reports worsening vision. No previous eye exam. Acknowledged need for assessment.  General Health Maintenance Discussed  vaccinations. Declined Tdap, Shingrix, pneumococcal vaccines. - Offered Tdap and pneumococcal vaccines, he declined.  Housing Instability - Improved, now has own home with help of [facility].   Bipolar 1 - mgmt by psych at [facility] - Pt unable to give updated med  list - will call [facility] - Previously managed with haldol , cogentin , wellbutrin, celexa, hydroxyzine, risperdal  Health Maintenance  Topic Date Due   DTaP/Tdap/Td vaccine (1 - Tdap) Never done   Pneumococcal Vaccination (1 of 2 - PCV) Never done   Colon Cancer Screening  Never done   Zoster (Shingles) Vaccine (1 of 2) Never done   COVID-19 Vaccine (2 - 2024-25 season) 12/10/2022   Hepatitis B Vaccine (1 of 3 - Risk 3-dose series) Never done   Flu Shot  11/09/2023   Hepatitis C Screening  Completed   HIV Screening  Completed   HPV Vaccine  Aged Out   Meningitis B Vaccine  Aged Out    Discussed health benefits of physical activity, and encouraged him to engage in regular exercise appropriate for his age and condition.  Annual physical exam -     Lipid panel -     TSH -     Comprehensive metabolic panel with GFR -     CBC -     PSA  Screening for colon cancer -     Ambulatory referral to Gastroenterology  Chronic active hepatitis [facility]) -     Ambulatory referral to Infectious Disease  Bipolar 1 disorder (HCC)  Housing instability     Return in about 6 months (around 04/26/2024).  I, Curtis DELENA Boom, FNP, have reviewed all documentation for this visit. The documentation on 10/26/23 for the exam, diagnosis, procedures, and orders are all accurate and complete.   Curtis DELENA Boom, FNP  "

## 2023-10-26 ENCOUNTER — Ambulatory Visit: Payer: Self-pay | Admitting: Family Medicine

## 2023-10-26 ENCOUNTER — Encounter: Payer: Self-pay | Admitting: Family Medicine

## 2023-10-26 ENCOUNTER — Other Ambulatory Visit: Payer: Self-pay

## 2023-10-26 LAB — COMPREHENSIVE METABOLIC PANEL WITH GFR
ALT: 38 IU/L (ref 0–44)
AST: 32 IU/L (ref 0–40)
Albumin: 3.9 g/dL (ref 3.8–4.9)
Alkaline Phosphatase: 76 IU/L (ref 44–121)
BUN/Creatinine Ratio: 17 (ref 10–24)
BUN: 15 mg/dL (ref 8–27)
Bilirubin Total: 0.4 mg/dL (ref 0.0–1.2)
CO2: 21 mmol/L (ref 20–29)
Calcium: 9 mg/dL (ref 8.6–10.2)
Chloride: 104 mmol/L (ref 96–106)
Creatinine, Ser: 0.89 mg/dL (ref 0.76–1.27)
Globulin, Total: 2.8 g/dL (ref 1.5–4.5)
Glucose: 83 mg/dL (ref 70–99)
Potassium: 4.3 mmol/L (ref 3.5–5.2)
Sodium: 137 mmol/L (ref 134–144)
Total Protein: 6.7 g/dL (ref 6.0–8.5)
eGFR: 98 mL/min/1.73 (ref >59)

## 2023-10-26 LAB — CBC
Hematocrit: 40.5 % (ref 37.5–51.0)
Hemoglobin: 13.5 g/dL (ref 13.0–17.7)
MCH: 30.8 pg (ref 26.6–33.0)
MCHC: 33.3 g/dL (ref 31.5–35.7)
MCV: 92 fL (ref 79–97)
Platelets: 195 x10E3/uL (ref 150–450)
RBC: 4.39 x10E6/uL (ref 4.14–5.80)
RDW: 13.3 % (ref 11.6–15.4)
WBC: 8.7 x10E3/uL (ref 3.4–10.8)

## 2023-10-26 LAB — LIPID PANEL
Chol/HDL Ratio: 3.3 ratio (ref 0.0–5.0)
Cholesterol, Total: 143 mg/dL (ref 100–199)
HDL: 44 mg/dL (ref >39)
LDL Chol Calc (NIH): 85 mg/dL (ref 0–99)
Triglycerides: 71 mg/dL (ref 0–149)
VLDL Cholesterol Cal: 14 mg/dL (ref 5–40)

## 2023-10-26 LAB — PSA: Prostate Specific Ag, Serum: 0.2 ng/mL (ref 0.0–4.0)

## 2023-10-26 LAB — TSH: TSH: 1.76 u[IU]/mL (ref 0.450–4.500)

## 2023-10-26 NOTE — Progress Notes (Signed)
 LVM letting patient know that his results were normal and if any questions to call office back.

## 2023-10-26 NOTE — Progress Notes (Signed)
 Called and spoke with someone from [facility], she gave me an updated medication list for the patient because yesterday at his appointment he stated he didn't know what he was taking.  [facility] manages these medications due to it being a [facility] facility.

## 2023-10-29 ENCOUNTER — Telehealth: Payer: Self-pay

## 2023-10-29 ENCOUNTER — Other Ambulatory Visit: Payer: Self-pay

## 2023-10-29 DIAGNOSIS — Z1211 Encounter for screening for malignant neoplasm of colon: Secondary | ICD-10-CM

## 2023-10-29 MED ORDER — GOLYTELY 236 G PO SOLR: 4000.0000 mL | Freq: Once | ORAL | 0 refills | Status: AC

## 2023-10-29 NOTE — Telephone Encounter (Signed)
 Please advise on all refills LOV 10/25/23 NOV n/a All found under Misc Dispense pharmacy Benztropine   LRF 08/02/23 qty:56 r:0 [facility])  Bupropion  LRF 08/02/23 qty: 28 r:0 ([facility])  Citalopram LRF 08/02/23 qty:28 r:0 [facility])  Hydroxyzine LRF 08/02/23 qty:60 r:0 [facility])  [place] LRF 10/08/23 qty:28 r:0 ([facility])

## 2023-10-29 NOTE — Telephone Encounter (Signed)
 I don't not prescribe these medications, this is through his psychiatrist at [facility].

## 2023-10-29 NOTE — Telephone Encounter (Signed)
 Colonoscopy has been scheduled with the assistance of [facility] team member Lonni Griffes (Listed on [place]).  Gastroenterology Pre-Procedure Review  Request Date: 01/23/24 Requesting Physician: Dr. Marinda  PATIENT REVIEW QUESTIONS: The patient's care guide responded to the following health history questions as indicated:    1. Are you having any GI issues? no 2. Do you have a personal history of Polyps? no 3. Do you have a family history of Colon Cancer or Polyps? no 4. Diabetes Mellitus? no 5. Joint replacements in the past 12 months?no 6. Major health problems in the past 3 months?no 7. Any artificial heart valves, MVP, or defibrillator?no    MEDICATIONS & ALLERGIES:    Patient reports the following regarding taking any anticoagulation/antiplatelet therapy:   Plavix, Coumadin, Eliquis, Xarelto, Lovenox , Pradaxa, Brilinta, or Effient? no Aspirin? no  Patient confirms/reports the following medications:  Current Outpatient Medications  Medication Sig Dispense Refill   benztropine  (COGENTIN ) 1 MG tablet Take 1 tablet (1 mg total) by mouth once daily. 30 tablet 2   buPROPion (WELLBUTRIN XL) 150 MG 24 hr tablet Take 150 mg by mouth daily.     citalopram (CELEXA) 20 MG tablet Take 20 mg by mouth daily.     hydrOXYzine (ATARAX) 25 MG tablet Take 25 mg by mouth 3 (three) times daily as needed for anxiety.     UZEDY 125 MG/0.35ML SUSY Inject 150 mg into the skin every 30 (thirty) days.     No current facility-administered medications for this visit.    Patient confirms/reports the following allergies:  No Known Allergies  No orders of the defined types were placed in this encounter.   AUTHORIZATION INFORMATION Primary Insurance: 1D#: Group #:  Secondary Insurance: 1D#: Group #:  SCHEDULE INFORMATION: Date: 01/23/24 Time: Location: [facility]

## 2023-10-29 NOTE — Telephone Encounter (Signed)
 Copied from CRM [id number]. Topic: Clinical - Prescription Issue >> Oct 29, 2023  3:26 PM Tiffini S wrote: Reason for CRM: Sharolyn with [facility] [phone] is calling to have all new scripts faxed in for the patients current medication/ fax number is [phone]

## 2023-10-30 NOTE — Telephone Encounter (Signed)
 Called Roger with [facility] and let him know that the only medications this patient is on is psych meds and they are managed by [facility].  He verbalized understanding.

## 2023-11-05 ENCOUNTER — Emergency Department
Admission: EM | Admit: 2023-11-05 | Discharge: 2023-11-05 | Discharge status: Against Medical Advice | Payer: MEDICAID | Attending: Emergency Medicine | Admitting: Emergency Medicine

## 2023-11-05 DIAGNOSIS — Z5321 Procedure and treatment not carried out due to patient leaving prior to being seen by health care provider: Secondary | ICD-10-CM | POA: Insufficient documentation

## 2023-11-05 DIAGNOSIS — R462 Strange and inexplicable behavior: Secondary | ICD-10-CM | POA: Insufficient documentation

## 2023-11-05 NOTE — ED Notes (Signed)
 Pt pacing around lobby stating he is calling a cab to go home. Pt refusing triage at this time.

## 2023-11-05 NOTE — ED Triage Notes (Signed)
 First nurse note: Pt here via [facility] with psych complaint, pt placed himself on the floor and is having erratic behavior at this time, pt keeps stating I dont know what's wrong and wont stay in wheelchair even after enforcing he is a fall risk and we did not want him to hurt himself, pt stormed to mens restroom and started taking handfuls of water and throwing it in his face.   VSS per EMS.  Pt placed in subwait with tech and security due to unpredictable behavior and a lobby full of sick pt's.

## 2023-11-08 ENCOUNTER — Encounter: Payer: MEDICAID | Admitting: Infectious Diseases

## 2023-11-15 ENCOUNTER — Ambulatory Visit: Payer: MEDICAID | Attending: Infectious Diseases | Admitting: Infectious Diseases

## 2023-11-15 ENCOUNTER — Encounter: Payer: Self-pay | Admitting: Infectious Diseases

## 2023-11-15 ENCOUNTER — Other Ambulatory Visit
Admission: RE | Admit: 2023-11-15 | Discharge: 2023-11-15 | Disposition: A | Discharge status: Home / Self Care | Payer: MEDICAID | Attending: Infectious Diseases | Admitting: Infectious Diseases

## 2023-11-15 VITALS — BP 104/71 | HR 65 | Temp 97.7°F | Ht 67.5 in | Wt 163.0 lb

## 2023-11-15 DIAGNOSIS — L309 Dermatitis, unspecified: Secondary | ICD-10-CM | POA: Diagnosis not present

## 2023-11-15 DIAGNOSIS — F1911 Other psychoactive substance abuse, in remission: Secondary | ICD-10-CM | POA: Diagnosis not present

## 2023-11-15 DIAGNOSIS — B182 Chronic viral hepatitis C: Secondary | ICD-10-CM | POA: Diagnosis present

## 2023-11-15 DIAGNOSIS — Z79899 Other long term (current) drug therapy: Secondary | ICD-10-CM | POA: Insufficient documentation

## 2023-11-15 DIAGNOSIS — F1411 Cocaine abuse, in remission: Secondary | ICD-10-CM | POA: Diagnosis not present

## 2023-11-15 DIAGNOSIS — I10 Essential (primary) hypertension: Secondary | ICD-10-CM | POA: Diagnosis not present

## 2023-11-15 DIAGNOSIS — F17220 Nicotine dependence, chewing tobacco, uncomplicated: Secondary | ICD-10-CM | POA: Insufficient documentation

## 2023-11-15 DIAGNOSIS — K732 Chronic active hepatitis, not elsewhere classified: Secondary | ICD-10-CM | POA: Diagnosis present

## 2023-11-15 DIAGNOSIS — F1511 Other stimulant abuse, in remission: Secondary | ICD-10-CM | POA: Diagnosis not present

## 2023-11-15 DIAGNOSIS — F319 Bipolar disorder, unspecified: Secondary | ICD-10-CM | POA: Insufficient documentation

## 2023-11-15 LAB — HEPATITIS A ANTIBODY, TOTAL: hep A Total Ab: NONREACTIVE

## 2023-11-15 NOTE — Patient Instructions (Signed)
 Today, you came in to discuss treatment for your hepatitis C. You have known about your diagnosis for one to two years but have not received any treatment yet. We also talked about your eczema on both thumbs.  YOUR PLAN:  -CHRONIC HEPATITIS C INFECTION: Hepatitis C is a liver infection caused by the hepatitis C virus. We will start by ordering blood tests to check your liver's health and confirm the presence of the virus. We will also evaluate if you need a hepatitis B vaccination. Once we have the results, we will coordinate with the pharmacy to deliver your medication and provide education on how to take it. You will start a 12-week course of antiviral medication to treat the infection.  -ECZEMA OF BILATERAL THUMBS: Eczema is a condition that makes your skin red, inflamed, and itchy. It is present on both of your thumbs. We will discuss treatment options to help manage this condition.  INSTRUCTIONS:  Please go to the lab to get your blood tests done as soon as possible. We will schedule a follow-up appointment to discuss the results and start your hepatitis C treatment. Make sure to keep your contact information up to date so we can coordinate your medication delivery and provide further instructions.

## 2023-11-15 NOTE — Progress Notes (Signed)
 NAME: Nicholas Lindsey  DOB: 1965/05/31  MRN: [id number]  Date/Time: 11/15/2023 10:12 AM   Subjective:   ?  Nicholas Lindsey is a 58 year old male with hepatitis C who presents for hepatitis C treatment.  He has been aware of his hepatitis C diagnosis for one to two years and has not received any treatment for this condition previously.  hepatitis C virus PCR test conducted in 2023 showed a viral load of 251,000.  He has a history of substance use, including intravenous drug use, which he ceased several years ago. He denies current use of marijuana, cocaine, heroin, or alcohol. He lives alone and works part-time at [employer] with varying hours.  His past medical history includes high blood pressure and bipolar disorder, for which he is managed by a psychiatrist at [facility]. He takes Celexa, Wellbutrin, Cogentin , and receives injections at home for his psychiatric condition. He also takes Vistaril as needed for anxiety.  He reports occasional abdominal pain. No symptoms of acid reflux, vomiting, or blood in stools. He is not sexually active and has not been seen by the health department before. He has stable living conditions with an active phone number and address. He is enrolled with [facility] and the team member Maude schneider was with him He is with [facility] for [facility] management  Past Medical History:  Diagnosis Date   Anxiety    Arthritis    Depression    History of kidney stones    Left breast abscess 2008   I&D required- [place], [place] (per patient)   Nodule of anterior chest wall 01/19/2017   Substance abuse (HCC)     Past Surgical History:  Procedure Laterality Date   ABCESS DRAINAGE Left 2008   Breast- [place], [place]    Social History   Socioeconomic History   Marital status: Single    Spouse name: Not on file   Number of children: Not on file   Years of education: Not on file   Highest education level: Not on file  Occupational History   Not on file   Tobacco Use   Smoking status: Never   Smokeless tobacco: Current    Types: Chew   Tobacco comments:    1/2 Can chew Daily  Vaping Use   Vaping status: Never Used  Substance and Sexual Activity   Alcohol use: Not Currently    Comment: 12 Beers / Weekly   Drug use: Not Currently    Types: Methamphetamines, Cocaine, IV    Comment: NO DRUG USE IN 2 YEARS AS OF 2025   Sexual activity: Not Currently  Other Topics Concern   Not on file  Social History Narrative   Not on file   Social Drivers of Health   Financial Resource Strain: Low Risk  (02/28/2023)   Received from [facility]   Overall Financial Resource Strain (CARDIA)    Difficulty of Paying Living Expenses: Not hard at all  Food Insecurity: No Food Insecurity (02/28/2023)   [facility]   Hunger Vital Sign    Within the past 12 months, you worried that your food would run out before you got the money to buy more.: Never true    Within the past 12 months, the food you bought just didn't last and you didn't have money to get more.: Never true  Transportation Needs: No Transportation Needs (02/28/2023)   Received from [facility] - [facility]    Lack of  Transportation (Medical): No    Lack of Transportation (Non-Medical): No  Physical Activity: Not on file  Stress: Not on file  Social Connections: Not on file  Intimate Partner Violence: Not on file    Family History  Problem Relation Age of Onset   Diabetes Mother    Hypertension Mother    Heart attack Father    No Known Allergies I? Current Outpatient Medications  Medication Sig Dispense Refill   benztropine  (COGENTIN ) 1 MG tablet Take 1 mg by mouth 2 (two) times daily.     buPROPion (WELLBUTRIN XL) 150 MG 24 hr tablet Take 150 mg by mouth daily.     citalopram (CELEXA) 20 MG tablet Take 20 mg by mouth daily.     hydrOXYzine (VISTARIL) 25 MG capsule Take 25 mg by mouth 2 (two) times daily.     UZEDY 125 MG/0.35ML SUSY  Inject 150 mg into the skin every 30 (thirty) days.     No current facility-administered medications for this visit.     Abtx:  Anti-infectives (From admission, onward)    None       REVIEW OF SYSTEMS:  Const: negative fever, negative chills, negative weight loss Eyes: negative diplopia or visual changes, negative eye pain ENT: negative coryza, negative sore throat Resp: negative cough, hemoptysis, dyspnea Cards: negative for chest pain, palpitations, lower extremity edema GU: negative for frequency, dysuria and hematuria GI: Negative for abdominal pain, diarrhea, bleeding, constipation Skin: negative for rash and pruritus Heme: negative for easy bruising and gum/nose bleeding MS: negative for myalgias, arthralgias, back pain and muscle weakness Neurolo:negative for headaches, dizziness, vertigo, memory problems  Psych:  anxiety, depression  Endocrine: negative for thyroid, diabetes Allergy/Immunology- negative for any medication or food allergies ? Pertinent Positives include : Objective:  VITALS:  BP 104/71   Pulse 65   Temp 97.7 F (36.5 C) (Temporal)   Ht 5' 7.5 (1.715 m)   Wt 163 lb (73.9 kg)   SpO2 96%   BMI 25.15 kg/m   PHYSICAL EXAM:  General: Alert, cooperative, no distress, appears stated age.  Head: Normocephalic, without obvious abnormality, atraumatic. Eyes: Conjunctivae clear, anicteric sclerae. Pupils are equal ENT Nares normal. No drainage or sinus tenderness. Lips, mucosa, and tongue normal. No Thrush Poor dentition Neck: Supple, symmetrical, no adenopathy, thyroid: non tender no carotid bruit and no JVD. Back: No CVA tenderness. Lungs: Clear to auscultation bilaterally. No Wheezing or Rhonchi. No rales. Heart: Regular rate and rhythm, no murmur, rub or gallop. Abdomen: Soft, non-tender,not distended. Bowel sounds normal. No masses Extremities: atraumatic, no cyanosis. No edema. No clubbing Skin: b/l thumb eczema Lymph: Cervical,  supraclavicular normal. Neurologic: Grossly non-focal Pertinent Labs Lab Results CBC    Component Value Date/Time   WBC 8.7 10/25/2023 1557   WBC 9.5 02/25/2023 2303   RBC 4.39 10/25/2023 1557   RBC 4.78 02/25/2023 2303   HGB 13.5 10/25/2023 1557   HCT 40.5 10/25/2023 1557   PLT 195 10/25/2023 1557   MCV 92 10/25/2023 1557   MCV 93 09/11/2013 1820   MCH 30.8 10/25/2023 1557   MCH 31.2 02/25/2023 2303   MCHC 33.3 10/25/2023 1557   MCHC 34.5 02/25/2023 2303   RDW 13.3 10/25/2023 1557   RDW 14.3 09/11/2013 1820   LYMPHSABS 2.4 02/25/2023 2303   LYMPHSABS 1.7 09/10/2019 1211   MONOABS 0.8 02/25/2023 2303   EOSABS 0.2 02/25/2023 2303   EOSABS 0.1 09/10/2019 1211   BASOSABS 0.1 02/25/2023 2303   BASOSABS 0.0 09/10/2019  1211       Latest Ref Rng & Units 10/25/2023    3:57 PM 04/27/2023   10:34 AM 02/25/2023   11:03 PM  CMP  Glucose 70 - 99 mg/dL 83  96  827   BUN 8 - 27 mg/dL 15  8  26    Creatinine 0.76 - 1.27 mg/dL 9.10  9.28  9.08   Sodium 134 - 144 mmol/L 137  141  133   Potassium 3.5 - 5.2 mmol/L 4.3  4.7  3.8   Chloride 96 - 106 mmol/L 104  105  103   CO2 20 - 29 mmol/L 21  23  21    Calcium 8.6 - 10.2 mg/dL 9.0  9.1  8.3   Total Protein 6.0 - 8.5 g/dL 6.7  6.8    Total Bilirubin 0.0 - 1.2 mg/dL 0.4  0.3    Alkaline Phos 44 - 121 IU/L 76  96    AST 0 - 40 IU/L 32  47    ALT 0 - 44 IU/L 38  43        Microbiology: No results found for this or any previous visit (from the past 240 hours).  Lines and Device Date on insertion # of days DC  Central line     Foley     ETT      Tests result DATE comment  HEPC RNA     HEPC  Genotype     Hepatitis B profile     Hepatitis A status     PT/PTT     AST, ALT, Bilirubin     Albumin     creatinine     HB/Platelet     HIV     AFP     TSH     comorbidities      tobacco     alcohol     drug     APRI Score     FIB 4 score     Current meds     Ultrasound Elastography     Preg Test     Vaccination HEPA/HEPB            IMAGING RESULTS: none  ? Impression/Recommendation ?Chronic hepatitis C infection   Diagnosed 1-2 years ago with a viral load of 251,000 in 2023. He has not received prior treatment and does not use alcohol or drugs, which benefits liver health. Order blood tests to assess liver status and check for active hepatitis C virus. Evaluate the need for hepatitis B /A vaccination. Coordinate with pharmacy for medication delivery and education. Schedule a follow-up appointment to discuss lab results and start a 12-week course of epclusa DDI epclusa and risperidone ( uzedy) can increase the level in blood- The [facility] SW will discuss with his psychiatrist- gave a print out of interactions to him  Also vistaril and uzedy CNS depressant Eczema of bilateral thumbs   Eczema is present on both thumbs. ?    ________________________________________________ Discussed with patient, and the social worker Follow up 2 weeks

## 2023-11-16 LAB — HCV RNA QUANT
HCV Quantitative Log: 5.978 {Log_IU}/mL (ref >1.70)
HCV Quantitative: 950000 [IU]/mL (ref >50)

## 2023-11-16 LAB — RPR: RPR Ser Ql: NONREACTIVE

## 2023-11-16 LAB — HEPATITIS B SURFACE ANTIBODY, QUANTITATIVE: Hep B S AB Quant (Post): <3.5 m[IU]/mL — ABNORMAL LOW

## 2023-11-16 LAB — HIV ANTIBODY (ROUTINE TESTING W REFLEX): HIV Screen 4th Generation wRfx: NONREACTIVE

## 2023-11-17 LAB — HCV FIBROSURE
ALPHA 2-MACROGLOBULINS, QN: 354 mg/dL — ABNORMAL HIGH (ref 110–276)
ALT (SGPT) P5P: 49 IU/L (ref 0–55)
Apolipoprotein A-1: 122 mg/dL (ref 101–178)
Bilirubin, Total: 0.4 mg/dL (ref 0.0–1.2)
Fibrosis Score: 0.49 — ABNORMAL HIGH (ref 0.00–0.21)
GGT: 18 IU/L (ref 0–65)
Haptoglobin: 145 mg/dL (ref 29–370)
Necroinflammat Activity Score: 0.34 — ABNORMAL HIGH (ref 0.00–0.17)

## 2023-11-18 LAB — HEPATITIS C GENOTYPE: HCV Genotype: 3

## 2023-11-20 ENCOUNTER — Other Ambulatory Visit (HOSPITAL_COMMUNITY): Payer: Self-pay

## 2023-11-21 ENCOUNTER — Other Ambulatory Visit: Payer: Self-pay | Admitting: Pharmacist

## 2023-11-21 DIAGNOSIS — B182 Chronic viral hepatitis C: Secondary | ICD-10-CM

## 2023-11-21 MED ORDER — SOFOSBUVIR-VELPATASVIR 400-100 MG PO TABS
1.0000 | ORAL_TABLET | Freq: Every day | ORAL | 2 refills | Status: AC
  Filled 2023-11-22: qty 28, 28d supply, fill #0
  Filled 2023-12-27: qty 28, 28d supply, fill #1
  Filled 2024-01-21: qty 28, 28d supply, fill #2

## 2023-11-22 ENCOUNTER — Other Ambulatory Visit: Payer: Self-pay

## 2023-11-22 ENCOUNTER — Other Ambulatory Visit (HOSPITAL_COMMUNITY): Payer: Self-pay

## 2023-11-22 NOTE — Progress Notes (Signed)
 Specialty Pharmacy Initial Fill Coordination Note  Nicholas Lindsey is a 58 y.o. male contacted today regarding initial fill of specialty medication(s) Sofosbuvir -Velpatasvir    Patient requested Courier to Provider Office   Delivery date: 11/26/23   Verified address: [address] [place] [address]   Medication will be filled on 11/23/23.   Patient is aware of $4.00 copayment.  Put on AR/ACCT

## 2023-11-23 ENCOUNTER — Other Ambulatory Visit: Payer: Self-pay

## 2023-11-23 ENCOUNTER — Other Ambulatory Visit (HOSPITAL_COMMUNITY): Payer: Self-pay

## 2023-11-27 ENCOUNTER — Telehealth: Payer: Self-pay

## 2023-11-27 ENCOUNTER — Other Ambulatory Visit: Payer: Self-pay | Admitting: Pharmacist

## 2023-11-27 ENCOUNTER — Other Ambulatory Visit: Payer: Self-pay

## 2023-11-27 NOTE — Progress Notes (Signed)
 Specialty Pharmacy Initiation Note   BRAINARD HIGHFILL is a 58 y.o. male who will be followed by the specialty pharmacy service for RxSp HIV    Review of administration, indication, effectiveness, safety, potential side effects, storage/disposable, and missed dose instructions occurred today for patient's specialty medication(s) No data recorded    Patient/Caregiver did not have any additional questions or concerns.  Patient will discuss counseling in detail with Diminique Veva, CMA.   Patient's therapy is appropriate to: Initiate    Goals Addressed             This Visit's Progress    Achieve virologic cure as evidenced by SVR       Patient is initiating therapy. Patient will be evaluated at upcoming provider appointment to assess progress      Comply with lab assessments       Patient is initiating therapy. Patient will adhere to provider and/or lab appointments      Maintain optimal adherence to therapy       Patient is initiating therapy. Patient will be evaluated at upcoming provider appointment to assess progress         Alan JINNY Geralds Specialty Pharmacist

## 2023-11-27 NOTE — Telephone Encounter (Signed)
 [facility] Patient Advocate Encounter  Patient's medications Epclusa  have been couriered to [facility] from [facility] and will be picked up at the patients appointment on 11/29/23.  Arland Hutchinson, CPhT Specialty Pharmacy Patient [facility] for [facility]ious Disease Phone: [phone] Fax:  [phone]

## 2023-11-27 NOTE — Progress Notes (Signed)
 Specialty Pharmacy Initial Fill Coordination Note  Nicholas Lindsey is a 58 y.o. male contacted today regarding initial fill of specialty medication(s) Sofosbuvir -Velpatasvir    Patient requested Courier to Provider Office   Delivery date: 11/26/23   Verified address: [address] [place] [address]   Medication will be filled on 11/22/23.   Patient is aware of 4.00 copayment.

## 2023-11-29 ENCOUNTER — Telehealth: Payer: Self-pay | Admitting: Pharmacist

## 2023-11-29 ENCOUNTER — Encounter: Payer: Self-pay | Admitting: Infectious Diseases

## 2023-11-29 ENCOUNTER — Ambulatory Visit: Payer: MEDICAID | Attending: Infectious Diseases | Admitting: Infectious Diseases

## 2023-11-29 VITALS — BP 118/76 | HR 93 | Temp 97.9°F | Ht 67.0 in | Wt 165.0 lb

## 2023-11-29 DIAGNOSIS — L309 Dermatitis, unspecified: Secondary | ICD-10-CM | POA: Diagnosis not present

## 2023-11-29 DIAGNOSIS — B182 Chronic viral hepatitis C: Secondary | ICD-10-CM | POA: Diagnosis not present

## 2023-11-29 DIAGNOSIS — B1920 Unspecified viral hepatitis C without hepatic coma: Secondary | ICD-10-CM | POA: Diagnosis present

## 2023-11-29 DIAGNOSIS — Z23 Encounter for immunization: Secondary | ICD-10-CM

## 2023-11-29 DIAGNOSIS — Z79899 Other long term (current) drug therapy: Secondary | ICD-10-CM | POA: Diagnosis not present

## 2023-11-29 NOTE — Patient Instructions (Signed)
 Today you will get HEP A and HEP B vaccination From tomorrow start Epclusa  1 tablet once a day Follow up appt in 4 weeks

## 2023-11-29 NOTE — Telephone Encounter (Signed)
 Patient is approved to receive Epclusa  x 12 weeks for chronic Hepatitis C infection. Counseled patient to take Epclusa  daily with or without food. Encouraged patient not to miss any doses and explained how their chance of cure could go down with each dose missed. Counseled patient on what to do if dose is missed - if it is closer to the missed dose take immediately; if closer to next dose skip dose and take the next dose at the usual time. Counseled patient on common side effects such as headache, fatigue, and nausea and that these normally decrease with time. I reviewed patient medications and found no drug interactions. Discussed with patient that there are several drug interactions including acid suppressants. Instructed patient to call clinic if he wishes to start a new medication during course of therapy. Also advised patient to call if he experiences any side effects. Patient will follow-up with Dr. Fayette on 12/27/23.   Alan Geralds, PharmD, CPP, BCIDP, AAHIVP Clinical Pharmacist Practitioner Infectious Diseases Clinical Pharmacist [facility]

## 2023-11-29 NOTE — Progress Notes (Signed)
 NAME: Nicholas Lindsey  DOB: 1966-02-22  MRN: [id number]  Date/Time: 11/29/2023 10:08 AM   Subjective:   ?pt here to discuss labs and start Epclusa  He has  1 million HEPC virus, genotype 3 Fibrosure F2- /A1 Normal PLT Normal LFTS HEPB sab < 3,5 HeP a antibody NR HE will start Epclusa   Following from last note  He has been aware of his hepatitis C diagnosis for one to two years and has not received any treatment for this condition previously.  hepatitis C virus PCR test conducted in 2023 showed a viral load of 251,000.  He has a history of substance use, including intravenous drug use, which he ceased several years ago. He denies current use of marijuana, cocaine, heroin, or alcohol. He lives alone and works part-time at [employer] with varying hours.  His past medical history includes high blood pressure and bipolar disorder, for which he is managed by a psychiatrist at [facility]. He takes Celexa, Wellbutrin, Cogentin , and receives injections at home for his psychiatric condition. He also takes Vistaril as needed for anxiety.  He reports occasional abdominal pain. No symptoms of acid reflux, vomiting, or blood in stools. He is not sexually active and has not been seen by the health department before. He has stable living conditions with an active phone number and address. He is enrolled with [facility] and the team member Maude schneider was with him [facility] with [facility]  Past Medical History:  Diagnosis Date   Anxiety    Arthritis    Depression    History of kidney stones    Left breast abscess 2008   I&D required- [place], [place] (per patient)   Nodule of anterior chest wall 01/19/2017   Substance abuse (HCC)     Past Surgical History:  Procedure Laterality Date   ABCESS DRAINAGE Left 2008   Breast- [place], [place]    Social History   Socioeconomic History   Marital status: Single    Spouse name: Not on file   Number of children: Not on file    Years of education: Not on file   Highest education level: Not on file  Occupational History   Not on file  Tobacco Use   Smoking status: Never   Smokeless tobacco: Current    Types: Chew   Tobacco comments:    1/2 Can chew Daily  Vaping Use   Vaping status: Never Used  Substance and Sexual Activity   Alcohol use: Not Currently    Comment: 12 Beers / Weekly   Drug use: Not Currently    Types: Methamphetamines, Cocaine, IV    Comment: NO DRUG USE IN 2 YEARS AS OF 2025   Sexual activity: Not Currently  Other Topics Concern   Not on file  Social History Narrative   Not on file   Social Drivers of Health   Financial Resource Strain: Low Risk  (02/28/2023)   Received from [facility]   Overall Financial Resource Strain (CARDIA)    Difficulty of Paying Living Expenses: Not hard at all  Food Insecurity: No Food Insecurity (02/28/2023)   Received from [facility]   Hunger Vital Sign    Within the past 12 months, you worried that your food would run out before you got the money to buy more.: Never true    Within the past 12 months, the food you bought just didn't last and you didn't have money to get more.: Never true  Transportation Needs:  No Transportation Needs (02/28/2023)   Received from [facility] - Transportation    Lack of Transportation (Medical): No    Lack of Transportation (Non-Medical): No  Physical Activity: Not on file  Stress: Not on file  Social Connections: Not on file  Intimate Partner Violence: Not on file    Family History  Problem Relation Age of Onset   Diabetes Mother    Hypertension Mother    Heart attack Father    No Known Allergies I? Current Outpatient Medications  Medication Sig Dispense Refill   benztropine  (COGENTIN ) 1 MG tablet Take 1 mg by mouth 2 (two) times daily.     buPROPion (WELLBUTRIN XL) 150 MG 24 hr tablet Take 150 mg by mouth daily.     citalopram (CELEXA) 20 MG tablet Take 20 mg by mouth daily.      hydrOXYzine (VISTARIL) 25 MG capsule Take 25 mg by mouth 2 (two) times daily.     Sofosbuvir -Velpatasvir  (EPCLUSA ) 400-100 MG TABS Take 1 tablet by mouth daily. 28 tablet 2   UZEDY 125 MG/0.35ML SUSY Inject 150 mg into the skin every 30 (thirty) days.     No current facility-administered medications for this visit.     Abtx:  Anti-infectives (From admission, onward)    None       REVIEW OF SYSTEMS:  Const: negative fever, negative chills, negative weight loss Eyes: negative diplopia or visual changes, negative eye pain ENT: negative coryza, negative sore throat Resp: negative cough, hemoptysis, dyspnea Cards: negative for chest pain, palpitations, lower extremity edema GU: negative for frequency, dysuria and hematuria GI: Negative for abdominal pain, diarrhea, bleeding, constipation Skin: negative for rash and pruritus Heme: negative for easy bruising and gum/nose bleeding MS: negative for myalgias, arthralgias, back pain and muscle weakness Neurolo:negative for headaches, dizziness, vertigo, memory problems  Psych:  anxiety, depression  Endocrine: negative for thyroid, diabetes Allergy/Immunology- negative for any medication or food allergies ? Pertinent Positives include : Objective:  VITALS:  BP 118/76   Pulse 93   Temp 97.9 F (36.6 C) (Temporal)   Ht 5' 7 (1.702 m)   Wt 165 lb (74.8 kg)   SpO2 97%   BMI 25.84 kg/m   PHYSICAL EXAM:  General: Alert, cooperative, no distress, appears stated age.  Head: Normocephalic, without obvious abnormality, atraumatic. Eyes: Conjunctivae clear, anicteric sclerae. Pupils are equal ENT Nares normal. No drainage or sinus tenderness. Lips, mucosa, and tongue normal. No Thrush Poor dentition Neck: Supple, symmetrical, no adenopathy, thyroid: non tender no carotid bruit and no JVD. Back: No CVA tenderness. Lungs: Clear to auscultation bilaterally. No Wheezing or Rhonchi. No rales. Heart: Regular rate and rhythm, no murmur, rub  or gallop. Abdomen: Soft, non-tender,not distended. Bowel sounds normal. No masses Extremities: atraumatic, no cyanosis. No edema. No clubbing Skin: b/l thumb eczema Lymph: Cervical, supraclavicular normal. Neurologic: Grossly non-focal Pertinent Labs Lab Results CBC    Component Value Date/Time   WBC 8.7 10/25/2023 1557   WBC 9.5 02/25/2023 2303   RBC 4.39 10/25/2023 1557   RBC 4.78 02/25/2023 2303   HGB 13.5 10/25/2023 1557   HCT 40.5 10/25/2023 1557   PLT 195 10/25/2023 1557   MCV 92 10/25/2023 1557   MCV 93 09/11/2013 1820   MCH 30.8 10/25/2023 1557   MCH 31.2 02/25/2023 2303   MCHC 33.3 10/25/2023 1557   MCHC 34.5 02/25/2023 2303   RDW 13.3 10/25/2023 1557   RDW 14.3 09/11/2013 1820   LYMPHSABS 2.4  02/25/2023 2303   LYMPHSABS 1.7 09/10/2019 1211   MONOABS 0.8 02/25/2023 2303   EOSABS 0.2 02/25/2023 2303   EOSABS 0.1 09/10/2019 1211   BASOSABS 0.1 02/25/2023 2303   BASOSABS 0.0 09/10/2019 1211       Latest Ref Rng & Units 10/25/2023    3:57 PM 04/27/2023   10:34 AM 02/25/2023   11:03 PM  CMP  Glucose 70 - 99 mg/dL 83  96  827   BUN 8 - 27 mg/dL 15  8  26    Creatinine 0.76 - 1.27 mg/dL 9.10  9.28  9.08   Sodium 134 - 144 mmol/L 137  141  133   Potassium 3.5 - 5.2 mmol/L 4.3  4.7  3.8   Chloride 96 - 106 mmol/L 104  105  103   CO2 20 - 29 mmol/L 21  23  21    Calcium 8.6 - 10.2 mg/dL 9.0  9.1  8.3   Total Protein 6.0 - 8.5 g/dL 6.7  6.8    Total Bilirubin 0.0 - 1.2 mg/dL 0.4  0.3    Alkaline Phos 44 - 121 IU/L 76  96    AST 0 - 40 IU/L 32  47    ALT 0 - 44 IU/L 38  43        Microbiology: No results found for this or any previous visit (from the past 240 hours).  Lines and Device Date on insertion # of days DC  Central line     Foley     ETT      Tests result DATE comment  HEPC RNA 950000    HEPC  Genotype 3    Hepatitis B profile Sab < 3.5    Hepatitis A status NR    PT/PTT     AST, ALT, Bilirubin N    Albumin     creatinine 0.89     HB/Platelet 13.5/195    HIV NR    AFP     TSH     comorbidities      tobacco     alcohol     drug     APRI Score     Fibrosure     Current meds     Ultrasound Elastography     Preg Test     Vaccination HEPA/HEPB           IMAGING RESULTS: none  ? 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 ?    ________________________________________________ Discussed with patient, and the social worker Follow up 4 weeks

## 2023-12-27 ENCOUNTER — Ambulatory Visit: Payer: MEDICAID | Attending: Infectious Diseases | Admitting: Infectious Diseases

## 2023-12-27 ENCOUNTER — Other Ambulatory Visit (HOSPITAL_COMMUNITY): Payer: Self-pay

## 2023-12-27 ENCOUNTER — Other Ambulatory Visit: Payer: Self-pay

## 2023-12-27 VITALS — BP 136/80 | HR 66 | Temp 98.1°F | Ht 67.0 in | Wt 171.0 lb

## 2023-12-27 DIAGNOSIS — B182 Chronic viral hepatitis C: Secondary | ICD-10-CM

## 2023-12-27 DIAGNOSIS — F419 Anxiety disorder, unspecified: Secondary | ICD-10-CM | POA: Diagnosis not present

## 2023-12-27 DIAGNOSIS — F319 Bipolar disorder, unspecified: Secondary | ICD-10-CM | POA: Insufficient documentation

## 2023-12-27 DIAGNOSIS — Z79899 Other long term (current) drug therapy: Secondary | ICD-10-CM | POA: Insufficient documentation

## 2023-12-27 DIAGNOSIS — L309 Dermatitis, unspecified: Secondary | ICD-10-CM | POA: Insufficient documentation

## 2023-12-27 NOTE — Progress Notes (Signed)
 NAME: Nicholas Lindsey  DOB: [birth year]  MRN: [id number]  Date/Time: 12/27/2023 11:46 AM   Subjective:   Pt for  HEPC-follow up   started EPCLUSA  on 11/29/23-  Doing well No side effects Has not missed any doses except 1 day    Following from last note  He has been aware of his hepatitis C diagnosis for one to two years and has not received any treatment for this condition previously.  hepatitis C virus PCR test conducted in 2023 showed a viral load of 251,000.  He has a history of substance use, including intravenous drug use, which he ceased several years ago. He denies current use of marijuana, cocaine, heroin, or alcohol. He lives alone and works part-time at [employer] with varying hours.  His past medical history includes high blood pressure and bipolar disorder, for which he is managed by a psychiatrist at [facility]. He takes Celexa, Wellbutrin, Cogentin , and receives injections at home for his psychiatric condition. He also takes Vistaril as needed for anxiety.  He reports occasional abdominal pain. No symptoms of acid reflux, vomiting, or blood in stools. He is not sexually active and has not been seen by the health department before. He has stable living conditions with an active phone number and address. [facility] is [facility] with [facility] and the team member Maude schneider was with him [facility] is with [facility]  Past Medical History:  Diagnosis Date   Anxiety    Arthritis    Depression    History of kidney stones    Left breast abscess 2008   I&D required- [place] (per patient)   Nodule of anterior chest wall 01/19/2017   Substance abuse (HCC)     Past Surgical History:  Procedure Laterality Date   ABCESS DRAINAGE Left 2008   Breast- [place], [place]    Social History   Socioeconomic History   Marital status: Single    Spouse name: Not on file   Number of children: Not on file   Years of education: Not on file   Highest education  level: Not on file  Occupational History   Not on file  Tobacco Use   Smoking status: Never   Smokeless tobacco: Current    Types: Chew   Tobacco comments:    1/2 Can chew Daily  Vaping Use   Vaping status: Never Used  Substance and Sexual Activity   Alcohol use: Not Currently    Comment: 12 Beers / Weekly   Drug use: Not Currently    Types: Methamphetamines, Cocaine, IV    Comment: NO DRUG USE IN 2 YEARS AS OF 2025   Sexual activity: Not Currently  Other Topics Concern   Not on file  Social History Narrative   Not on file   Social Drivers of Health   Financial Resource Strain: Low Risk  (02/28/2023)   [facility]   Overall Financial Resource Strain (CARDIA)    Difficulty of Paying Living Expenses: Not hard at all  Food Insecurity: No Food Insecurity (02/28/2023)   Received from [facility]   Hunger Vital Sign    Within the past 12 months, you worried that your food would run out before you got the money to buy more.: Never true    Within the past 12 months, the food you bought just didn't last and you didn't have money to get more.: Never true  Transportation Needs: No Transportation Needs (02/28/2023)   [facility]   PRAPARE - Transportation    Lack of Transportation (Medical): No    Lack of Transportation (Non-Medical): No  Physical Activity: Not on file  Stress: Not on file  Social Connections: Not on file  Intimate Partner Violence: Not on file    Family History  Problem Relation Age of Onset   Diabetes Mother    Hypertension Mother    Heart attack Father    No Known Allergies I? Current Outpatient Medications  Medication Sig Dispense Refill   benztropine  (COGENTIN ) 1 MG tablet Take 1 mg by mouth 2 (two) times daily.     buPROPion (WELLBUTRIN XL) 150 MG 24 hr tablet Take 150 mg by mouth daily.     citalopram (CELEXA) 20 MG tablet Take 20 mg by mouth daily.     hydrOXYzine (VISTARIL) 25 MG capsule Take 25 mg by mouth  2 (two) times daily.     Sofosbuvir -Velpatasvir  (EPCLUSA ) 400-100 MG TABS Take 1 tablet by mouth daily. 28 tablet 2   UZEDY 125 MG/0.35ML SUSY Inject 150 mg into the skin every 30 (thirty) days.     No current facility-administered medications for this visit.     Abtx:  Anti-infectives (From admission, onward)    None       REVIEW OF SYSTEMS:  Const: negative fever, negative chills, negative weight loss Eyes: negative diplopia or visual changes, negative eye pain ENT: negative coryza, negative sore throat Resp: negative cough, hemoptysis, dyspnea Cards: negative for chest pain, palpitations, lower extremity edema GU: negative for frequency, dysuria and hematuria GI: Negative for abdominal pain, diarrhea, bleeding, constipation Skin: negative for rash and pruritus Heme: negative for easy bruising and gum/nose bleeding MS: negative for myalgias, arthralgias, back pain and muscle weakness Neurolo:negative for headaches, dizziness, vertigo, memory problems  Psych:  anxiety, depression  Endocrine: negative for thyroid, diabetes Allergy/Immunology- negative for any medication or food allergies ? Pertinent Positives include : Objective:  VITALS:  BP 136/80   Pulse 66   Temp 98.1 F (36.7 C) (Temporal)   Ht 5' 7 (1.702 m)   Wt 171 lb (77.6 kg)   SpO2 96%   BMI 26.78 kg/m   PHYSICAL EXAM:  General: Alert, cooperative, no distress, appears stated age.  Head: Normocephalic, without obvious abnormality, atraumatic. Eyes: Conjunctivae clear, anicteric sclerae. Pupils are equal ENT Nares normal. No drainage or sinus tenderness. Lips, mucosa, and tongue normal. No Thrush Poor dentition Neck: Supple, symmetrical, no adenopathy, thyroid: non tender no carotid bruit and no JVD. Back: No CVA tenderness. Lungs: Clear to auscultation bilaterally. No Wheezing or Rhonchi. No rales. Heart: Regular rate and rhythm, no murmur, rub or gallop. Abdomen: Soft, non-tender,not distended.  Bowel sounds normal. No masses Extremities: atraumatic, no cyanosis. No edema. No clubbing Skin: b/l thumb eczema Lymph: Cervical, supraclavicular normal. Neurologic: Grossly non-focal Pertinent Labs Lab Results CBC    Component Value Date/Time   WBC 8.7 10/25/2023 1557   WBC 9.5 02/25/2023 2303   RBC 4.39 10/25/2023 1557   RBC 4.78 02/25/2023 2303   HGB 13.5 10/25/2023 1557   HCT 40.5 10/25/2023 1557   PLT 195 10/25/2023 1557   MCV 92 10/25/2023 1557   MCV 93 09/11/2013 1820   MCH 30.8 10/25/2023 1557   MCH 31.2 02/25/2023 2303   MCHC 33.3 10/25/2023 1557   MCHC 34.5 02/25/2023 2303   RDW 13.3 10/25/2023 1557   RDW 14.3 09/11/2013 1820   LYMPHSABS 2.4 02/25/2023 2303   LYMPHSABS 1.7 09/10/2019 1211  MONOABS 0.8 02/25/2023 2303   EOSABS 0.2 02/25/2023 2303   EOSABS 0.1 09/10/2019 1211   BASOSABS 0.1 02/25/2023 2303   BASOSABS 0.0 09/10/2019 1211       Latest Ref Rng & Units 10/25/2023    3:57 PM 04/27/2023   10:34 AM 02/25/2023   11:03 PM  CMP  Glucose 70 - 99 mg/dL 83  96  827   BUN 8 - 27 mg/dL 15  8  26    Creatinine 0.76 - 1.27 mg/dL 9.10  9.28  9.08   Sodium 134 - 144 mmol/L 137  141  133   Potassium 3.5 - 5.2 mmol/L 4.3  4.7  3.8   Chloride 96 - 106 mmol/L 104  105  103   CO2 20 - 29 mmol/L 21  23  21    Calcium 8.6 - 10.2 mg/dL 9.0  9.1  8.3   Total Protein 6.0 - 8.5 g/dL 6.7  6.8    Total Bilirubin 0.0 - 1.2 mg/dL 0.4  0.3    Alkaline Phos 44 - 121 IU/L 76  96    AST 0 - 40 IU/L 32  47    ALT 0 - 44 IU/L 38  43        Microbiology:   Lines and Device Date on insertion # of days DC  Central line     Foley     ETT      Tests result DATE comment  HEPC RNA 950000    HEPC  Genotype 3    Hepatitis B profile Sab < 3.5    Hepatitis A status NR    PT/PTT     AST, ALT, Bilirubin N    Albumin     creatinine 0.89    HB/Platelet 13.5/195    HIV NR    AFP     TSH     comorbidities      tobacco     alcohol     drug     APRI Score     Fibrosure  F2/A1    Current meds     Ultrasound Elastography     Preg Test     Vaccination HEPA/HEPB       He has  1 million HEPC virus, genotype 3 Fibrosure F2- /A1 Normal PLT Normal LFTS HEPB sab < 3,5 HeP a antibody NR     IMAGING RESULTS: none  ? 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 ?    ________________________________________________ Discussed with patient, and the social worker Follow up 4 weeks

## 2023-12-27 NOTE — Progress Notes (Signed)
 Specialty Pharmacy Refill Coordination Note  Nicholas Lindsey is a 58 y.o. male assessed today regarding refills of clinic administered specialty medication(s) Sofosbuvir -Velpatasvir    Clinic requested Courier to Provider Office   Delivery date: 12/31/23   Verified address: [address] [place] [address]   Medication will be filled on 12/28/23.

## 2023-12-28 ENCOUNTER — Other Ambulatory Visit: Payer: Self-pay

## 2023-12-31 ENCOUNTER — Telehealth: Payer: Self-pay

## 2023-12-31 ENCOUNTER — Other Ambulatory Visit (HOSPITAL_COMMUNITY): Payer: Self-pay

## 2023-12-31 NOTE — Telephone Encounter (Signed)
 [facility] Patient Advocate Encounter  Patient's medications Epclusa  have been couriered to [facility] from [facility] and will be picked up from [facility].  Epclusa  2nd box  Nicholas Lindsey, CPhT Specialty Pharmacy Patient [facility] Phone: [phone] Fax:  [phone]

## 2024-01-01 NOTE — Telephone Encounter (Signed)
 Patient's mediation picked up by Andrea Stalling with [facility] for the patient.  Verified ID.  Jalynn Betzold ONEIDA Ligas, CMA

## 2024-01-15 ENCOUNTER — Other Ambulatory Visit: Payer: Self-pay

## 2024-01-18 ENCOUNTER — Other Ambulatory Visit: Payer: Self-pay

## 2024-01-21 ENCOUNTER — Other Ambulatory Visit (HOSPITAL_COMMUNITY): Payer: Self-pay

## 2024-01-23 ENCOUNTER — Other Ambulatory Visit: Payer: Self-pay

## 2024-01-23 ENCOUNTER — Encounter: Payer: Self-pay | Admitting: General Surgery

## 2024-01-23 ENCOUNTER — Encounter
Admission: RE | Disposition: A | Discharge status: Home / Self Care | Payer: Self-pay | Source: Home / Self Care | Attending: General Surgery

## 2024-01-23 ENCOUNTER — Ambulatory Visit: Payer: MEDICAID | Admitting: Certified Registered Nurse Anesthetist

## 2024-01-23 ENCOUNTER — Ambulatory Visit
Admission: RE | Admit: 2024-01-23 | Discharge: 2024-01-23 | Disposition: A | Discharge status: Home / Self Care | Payer: MEDICAID | Attending: General Surgery | Admitting: General Surgery

## 2024-01-23 ENCOUNTER — Other Ambulatory Visit: Payer: Self-pay | Admitting: Pharmacy Technician

## 2024-01-23 DIAGNOSIS — Z1211 Encounter for screening for malignant neoplasm of colon: Secondary | ICD-10-CM | POA: Insufficient documentation

## 2024-01-23 DIAGNOSIS — Z538 Procedure and treatment not carried out for other reasons: Secondary | ICD-10-CM | POA: Diagnosis not present

## 2024-01-23 HISTORY — PX: COLONOSCOPY: SHX5424

## 2024-01-23 SURGERY — COLONOSCOPY
Anesthesia: General

## 2024-01-23 MED ORDER — SODIUM CHLORIDE 0.9 % IV SOLN: INTRAVENOUS | Status: DC

## 2024-01-23 MED ORDER — LIDOCAINE HCL (PF) 2 % IJ SOLN
INTRAMUSCULAR | Status: AC
  Filled 2024-01-23: qty 5

## 2024-01-23 MED ORDER — PROPOFOL 1000 MG/100ML IV EMUL
INTRAVENOUS | Status: AC
  Filled 2024-01-23: qty 300

## 2024-01-23 MED ORDER — GLYCOPYRROLATE 0.2 MG/ML IJ SOLN
INTRAMUSCULAR | Status: AC
  Filled 2024-01-23: qty 1

## 2024-01-23 NOTE — OR Nursing (Signed)
 Patient did not understand instructions for his prep day. Ate solid food yesterday and did not finish prep. Definitely not clean. Dr Marinda talked to him and plans on rescheduling. I explained to his Child psychotherapist

## 2024-01-23 NOTE — H&P (Signed)
 Patient had meal yesterday.  He is not having liquid stool.  He continues to have some solids.  At this time I do not think it is appropriate to proceed with colonoscopy.  He we will cancel the procedure and reschedule him for a different date.

## 2024-01-23 NOTE — Progress Notes (Signed)
 Specialty Pharmacy Refill Coordination Note  Nicholas Lindsey is a 59 y.o. male contacted today regarding refills of specialty medication(s) Sofosbuvir -Velpatasvir    Patient requested Courier to Provider Office   Delivery date: 01/24/24   Verified address: [facility] [address]   Medication will be filled on 01/23/24.

## 2024-01-24 ENCOUNTER — Ambulatory Visit: Payer: MEDICAID | Attending: Infectious Diseases | Admitting: Infectious Diseases

## 2024-01-24 ENCOUNTER — Encounter: Payer: Self-pay | Admitting: Infectious Diseases

## 2024-01-24 ENCOUNTER — Telehealth: Payer: Self-pay

## 2024-01-24 VITALS — BP 121/79 | HR 68 | Temp 97.5°F | Ht 67.0 in | Wt 172.0 lb

## 2024-01-24 DIAGNOSIS — F319 Bipolar disorder, unspecified: Secondary | ICD-10-CM | POA: Diagnosis not present

## 2024-01-24 DIAGNOSIS — B1920 Unspecified viral hepatitis C without hepatic coma: Secondary | ICD-10-CM | POA: Diagnosis present

## 2024-01-24 DIAGNOSIS — Z79899 Other long term (current) drug therapy: Secondary | ICD-10-CM | POA: Insufficient documentation

## 2024-01-24 DIAGNOSIS — L309 Dermatitis, unspecified: Secondary | ICD-10-CM | POA: Diagnosis not present

## 2024-01-24 DIAGNOSIS — R109 Unspecified abdominal pain: Secondary | ICD-10-CM | POA: Insufficient documentation

## 2024-01-24 DIAGNOSIS — Z23 Encounter for immunization: Secondary | ICD-10-CM | POA: Diagnosis not present

## 2024-01-24 DIAGNOSIS — B182 Chronic viral hepatitis C: Secondary | ICD-10-CM | POA: Diagnosis not present

## 2024-01-24 DIAGNOSIS — K7401 Hepatic fibrosis, early fibrosis: Secondary | ICD-10-CM | POA: Diagnosis not present

## 2024-01-24 DIAGNOSIS — F419 Anxiety disorder, unspecified: Secondary | ICD-10-CM | POA: Insufficient documentation

## 2024-01-24 DIAGNOSIS — I10 Essential (primary) hypertension: Secondary | ICD-10-CM | POA: Diagnosis not present

## 2024-01-24 NOTE — Telephone Encounter (Signed)
 [facility] Patient Advocate Encounter  Patient's medications Epclusa  have been couriered to [facility] from [facility] and will be picked up on 01/29/24.  3rd Epclusa  box  Arland Hutchinson, CPhT Specialty Pharmacy Patient [facility] for Infectious Disease Phone: [phone] Fax:  [phone]

## 2024-01-24 NOTE — Progress Notes (Signed)
 NAME: Nicholas Lindsey  DOB: [birth year]  MRN: [id number]  Date/Time: 01/24/2024 10:43 AM   Subjective:   Pt for  HEPC-follow up   started EPCLUSA  on 11/29/23-  Doing well No side effects May have missed 2 -3 doses     Following from last note  He has been aware of his hepatitis C diagnosis for one to two years and has not received any treatment for this condition previously.  hepatitis C virus PCR test conducted in 2023 showed a viral load of 251,000.  He has a history of substance use, including intravenous drug use, which he ceased several years ago. He denies current use of marijuana, cocaine, heroin, or alcohol. He lives alone and works part-time at [employer] with varying hours.  His past medical history includes high blood pressure and bipolar disorder, for which he is managed by a psychiatrist at [facility]. He takes Celexa, Wellbutrin, Cogentin , and receives injections at home for his psychiatric condition. He also takes Vistaril as needed for anxiety.  He reports occasional abdominal pain. No symptoms of acid reflux, vomiting, or blood in stools. He is not sexually active and has not been seen by the health department before. He has stable living conditions with an active phone number and address. [facility] with [facility] and the team member Maude schneider was with him He is with [facility] for [facility] management  Past Medical History:  Diagnosis Date   Anxiety    Arthritis    Depression    History of kidney stones    Left breast abscess 2008   I&D required- [place] (per patient)   Nodule of anterior chest wall 01/19/2017   Substance abuse (HCC)     Past Surgical History:  Procedure Laterality Date   ABCESS DRAINAGE Left 2008   Breast- [place], [place]   COLONOSCOPY N/A 01/23/2024   Procedure: COLONOSCOPY;  Surgeon: Marinda Jayson KIDD, MD;  Location: [facility] ENDOSCOPY;  Service: General;  Laterality: N/A;    Social History   Socioeconomic History    Marital status: Single    Spouse name: Not on file   Number of children: Not on file   Years of education: Not on file   Highest education level: Not on file  Occupational History   Not on file  Tobacco Use   Smoking status: Never   Smokeless tobacco: Current    Types: Chew   Tobacco comments:    1/2 Can chew Daily  Vaping Use   Vaping status: Never Used  Substance and Sexual Activity   Alcohol use: Not Currently    Comment: 12 Beers / Weekly   Drug use: Not Currently    Types: Methamphetamines, Cocaine, IV    Comment: NO DRUG USE IN 2 YEARS AS OF 2025   Sexual activity: Not Currently  Other Topics Concern   Not on file  Social History Narrative   Not on file   Social Drivers of Health   Financial Resource Strain: Low Risk  (02/28/2023)   Received from [facility]   Overall Financial Resource Strain (CARDIA)    Difficulty of Paying Living Expenses: Not hard at all  Food Insecurity: No Food Insecurity (02/28/2023)   Received from [facility] [place]   Hunger Vital Sign    Within the past 12 months, you worried that your food would run out before you got the money to buy more.: Never true    Within the past 12 months, the food you bought just  didn't last and you didn't have money to get more.: Never true  Transportation Needs: No Transportation Needs (02/28/2023)   Received from [facility] - [facility]    Lack of Transportation (Medical): No    Lack of Transportation (Non-Medical): No  Physical Activity: Not on file  Stress: Not on file  Social Connections: Not on file  Intimate Partner Violence: Not on file    Family History  Problem Relation Age of Onset   Diabetes Mother    Hypertension Mother    Heart attack Father    No Known Allergies I? Current Outpatient Medications  Medication Sig Dispense Refill   benztropine  (COGENTIN ) 1 MG tablet Take 1 mg by mouth 2 (two) times daily.     buPROPion (WELLBUTRIN XL) 150 MG 24 hr tablet Take 150 mg  by mouth daily.     citalopram (CELEXA) 20 MG tablet Take 20 mg by mouth daily.     hydrOXYzine (VISTARIL) 25 MG capsule Take 25 mg by mouth 2 (two) times daily.     Sofosbuvir -Velpatasvir  (EPCLUSA ) 400-100 MG TABS Take 1 tablet by mouth daily. 28 tablet 2   UZEDY 125 MG/0.35ML SUSY Inject 150 mg into the skin every 30 (thirty) days.     No current facility-administered medications for this visit.     Abtx:  Anti-infectives (From admission, onward)    None       REVIEW OF SYSTEMS:  Const: negative fever, negative chills, negative weight loss Eyes: negative diplopia or visual changes, negative eye pain ENT: negative coryza, negative sore throat Resp: negative cough, hemoptysis, dyspnea Cards: negative for chest pain, palpitations, lower extremity edema GU: negative for frequency, dysuria and hematuria GI: Negative for abdominal pain, diarrhea, bleeding, constipation Skin: negative for rash and pruritus Heme: negative for easy bruising and gum/nose bleeding MS: negative for myalgias, arthralgias, back pain and muscle weakness Neurolo:negative for headaches, dizziness, vertigo, memory problems  Psych:  anxiety, depression  Endocrine: negative for thyroid, diabetes Allergy/Immunology- negative for any medication or food allergies  Objective:  VITALS:  BP 121/79   Pulse 68   Temp (!) 97.5 F (36.4 C) (Temporal)   Ht 5' 7 (1.702 m)   Wt 172 lb (78 kg)   SpO2 95%   BMI 26.94 kg/m   PHYSICAL EXAM:  General: Alert, cooperative, no distress, appears stated age.  Head: Normocephalic, without obvious abnormality, atraumatic. Eyes: Conjunctivae clear, anicteric sclerae. Pupils are equal ENT Nares normal. No drainage or sinus tenderness. Lips, mucosa, and tongue normal. No Thrush Poor dentition Neck: Supple, symmetrical, no adenopathy, thyroid: non tender no carotid bruit and no JVD. Back: No CVA tenderness. Lungs: Clear to auscultation bilaterally. No Wheezing or Rhonchi.  No rales. Heart: Regular rate and rhythm, no murmur, rub or gallop. Abdomen: Soft, non-tender,not distended. Bowel sounds normal. No masses Extremities: atraumatic, no cyanosis. No edema. No clubbing Skin: b/l thumb eczema Lymph: Cervical, supraclavicular normal. Neurologic: Grossly non-focal Pertinent Labs Lab Results CBC    Component Value Date/Time   WBC 8.7 10/25/2023 1557   WBC 9.5 02/25/2023 2303   RBC 4.39 10/25/2023 1557   RBC 4.78 02/25/2023 2303   HGB 13.5 10/25/2023 1557   HCT 40.5 10/25/2023 1557   PLT 195 10/25/2023 1557   MCV 92 10/25/2023 1557   MCV 93 09/11/2013 1820   MCH 30.8 10/25/2023 1557   MCH 31.2 02/25/2023 2303   MCHC 33.3 10/25/2023 1557   MCHC 34.5 02/25/2023 2303   RDW 13.3  10/25/2023 1557   RDW 14.3 09/11/2013 1820   LYMPHSABS 2.4 02/25/2023 2303   LYMPHSABS 1.7 09/10/2019 1211   MONOABS 0.8 02/25/2023 2303   EOSABS 0.2 02/25/2023 2303   EOSABS 0.1 09/10/2019 1211   BASOSABS 0.1 02/25/2023 2303   BASOSABS 0.0 09/10/2019 1211       Latest Ref Rng & Units 10/25/2023    3:57 PM 04/27/2023   10:34 AM 02/25/2023   11:03 PM  CMP  Glucose 70 - 99 mg/dL 83  96  827   BUN 8 - 27 mg/dL 15  8  26    Creatinine 0.76 - 1.27 mg/dL 9.10  9.28  9.08   Sodium 134 - 144 mmol/L 137  141  133   Potassium 3.5 - 5.2 mmol/L 4.3  4.7  3.8   Chloride 96 - 106 mmol/L 104  105  103   CO2 20 - 29 mmol/L 21  23  21    Calcium 8.6 - 10.2 mg/dL 9.0  9.1  8.3   Total Protein 6.0 - 8.5 g/dL 6.7  6.8    Total Bilirubin 0.0 - 1.2 mg/dL 0.4  0.3    Alkaline Phos 44 - 121 IU/L 76  96    AST 0 - 40 IU/L 32  47    ALT 0 - 44 IU/L 38  43        Tests result DATE comment  HEPC RNA 950000    HEPC  Genotype 3    Hepatitis B profile Sab < 3.5    Hepatitis A status NR    PT/PTT     AST, ALT, Bilirubin N    Albumin     creatinine 0.89    HB/Platelet 13.5/195    HIV NR    AFP     TSH     comorbidities      tobacco     alcohol     drug     APRI Score      Fibrosure F2/A1    Current meds     Ultrasound Elastography     Preg Test     Vaccination HEPA/HEPB       He has  1 million HEPC virus, genotype 3 Fibrosure F2- /A1 Normal PLT Normal LFTS HEPB sab < 3,5 HeP a antibody NR     IMAGING RESULTS: none  ? 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 ?    ________________________________________________ Discussed with patient Follow up 3 months

## 2024-01-29 ENCOUNTER — Telehealth: Payer: Self-pay

## 2024-01-29 NOTE — Telephone Encounter (Signed)
 Nicholas Lindsey with the [facility] picked up medication today for the patient.  Nicholas Lindsey, CMA

## 2024-02-13 ENCOUNTER — Other Ambulatory Visit: Payer: Self-pay

## 2024-02-13 NOTE — Progress Notes (Signed)
 All 12 weeks of therapy dispensed

## 2024-05-20 ENCOUNTER — Ambulatory Visit: Payer: MEDICAID | Admitting: Infectious Diseases
